# Patient Record
Sex: Male | Born: 1950
Health system: Southern US, Community
[De-identification: ages and names within clinical notes are randomized; demographics above are authoritative.]

## PROBLEM LIST (undated history)

## (undated) DIAGNOSIS — E291 Testicular hypofunction: Secondary | ICD-10-CM

## (undated) DIAGNOSIS — E559 Vitamin D deficiency, unspecified: Secondary | ICD-10-CM

## (undated) DIAGNOSIS — E039 Hypothyroidism, unspecified: Secondary | ICD-10-CM

## (undated) DIAGNOSIS — I10 Essential (primary) hypertension: Secondary | ICD-10-CM

## (undated) DIAGNOSIS — E785 Hyperlipidemia, unspecified: Secondary | ICD-10-CM

## (undated) DIAGNOSIS — N400 Enlarged prostate without lower urinary tract symptoms: Secondary | ICD-10-CM

## (undated) HISTORY — PX: HAND TENDON SURGERY: SHX663

## (undated) HISTORY — PX: TONSILLECTOMY: SUR1361

---
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
## 2011-06-08 ENCOUNTER — Emergency Department (HOSPITAL_COMMUNITY)
Admission: EM | Admit: 2011-06-08 | Discharge: 2011-06-08 | Disposition: A | Discharge status: Home / Self Care | Attending: Emergency Medicine | Admitting: Emergency Medicine

## 2011-06-08 DIAGNOSIS — IMO0001 Reserved for inherently not codable concepts without codable children: Secondary | ICD-10-CM | POA: Insufficient documentation

## 2011-06-08 DIAGNOSIS — R51 Headache: Secondary | ICD-10-CM | POA: Insufficient documentation

## 2011-06-08 DIAGNOSIS — R197 Diarrhea, unspecified: Secondary | ICD-10-CM | POA: Insufficient documentation

## 2011-06-08 DIAGNOSIS — R059 Cough, unspecified: Secondary | ICD-10-CM | POA: Insufficient documentation

## 2011-06-08 DIAGNOSIS — J329 Chronic sinusitis, unspecified: Secondary | ICD-10-CM

## 2011-06-08 DIAGNOSIS — R6883 Chills (without fever): Secondary | ICD-10-CM | POA: Insufficient documentation

## 2011-06-08 DIAGNOSIS — J3489 Other specified disorders of nose and nasal sinuses: Secondary | ICD-10-CM | POA: Insufficient documentation

## 2011-06-08 DIAGNOSIS — R05 Cough: Secondary | ICD-10-CM | POA: Insufficient documentation

## 2011-06-08 DIAGNOSIS — J40 Bronchitis, not specified as acute or chronic: Secondary | ICD-10-CM | POA: Insufficient documentation

## 2011-06-08 DIAGNOSIS — F17200 Nicotine dependence, unspecified, uncomplicated: Secondary | ICD-10-CM | POA: Insufficient documentation

## 2011-06-08 MED ORDER — PREDNISONE 20 MG PO TABS
60.0000 mg | ORAL_TABLET | Freq: Once | ORAL | Status: AC
  Administered 2011-06-08: 60 mg via ORAL
  Filled 2011-06-08: qty 3

## 2011-06-08 MED ORDER — HYDROCOD POLST-CHLORPHEN POLST 10-8 MG/5ML PO LQCR
5.0000 mL | Freq: Once | ORAL | Status: AC
  Administered 2011-06-08: 5 mL via ORAL
  Filled 2011-06-08: qty 5

## 2011-06-08 MED ORDER — PREDNISONE 10 MG PO TABS: ORAL_TABLET | ORAL | Status: DC

## 2011-06-08 MED ORDER — CETIRIZINE-PSEUDOEPHEDRINE ER 5-120 MG PO TB12: 1.0000 | ORAL_TABLET | Freq: Two times a day (BID) | ORAL | Status: AC

## 2011-06-08 MED ORDER — PROMETHAZINE-PHENYLEPHRINE 6.25-5 MG/5ML PO SYRP: 5.0000 mL | ORAL_SOLUTION | Freq: Four times a day (QID) | ORAL | Status: DC | PRN

## 2011-06-08 NOTE — ED Notes (Signed)
Pt presents with cough, congestion, and diarrhea x 2 weeks. Pt states he had a fever 2 weeks ago and started to feel better and now is back to not feeling well. Pt has missed work from being sick.

## 2011-06-08 NOTE — ED Notes (Signed)
Pt states has had flu/URI symptoms x 14 days. Pt has persistent cough and body aches. Remains afebrile at this time.

## 2011-06-08 NOTE — ED Provider Notes (Signed)
History     CSN: [id number]  Arrival date & time 06/08/11  1638   None     Chief Complaint  Patient presents with  . Nasal Congestion  . Cough  . Diarrhea    (Consider location/radiation/quality/duration/timing/severity/associated sxs/prior treatment) Patient is a 60 y.o. male presenting with cough and diarrhea. The history is provided by the patient.  Cough This is a new problem. The current episode started more than 1 week ago. The problem occurs hourly. The problem has been gradually worsening. The cough is productive of sputum. Associated symptoms include chills, headaches and myalgias. Pertinent negatives include no chest pain, no shortness of breath and no wheezing. Treatments tried: otc cough meds. He is a smoker. His past medical history does not include bronchitis, pneumonia, COPD or asthma.  Diarrhea The primary symptoms include diarrhea and myalgias. Primary symptoms do not include abdominal pain, dysuria or arthralgias.  The illness is also significant for chills. The illness does not include back pain.    History reviewed. No pertinent past medical history.  History reviewed. No pertinent past surgical history.  No family history on file.  History  Substance Use Topics  . Smoking status: Current Some Day Smoker  . Smokeless tobacco: Not on file  . Alcohol Use: Yes      Review of Systems  Constitutional: Positive for chills. Negative for activity change.       All ROS Neg except as noted in HPI  HENT: Negative for nosebleeds and neck pain.   Eyes: Negative for photophobia and discharge.  Respiratory: Positive for cough. Negative for shortness of breath and wheezing.   Cardiovascular: Negative for chest pain and palpitations.  Gastrointestinal: Positive for diarrhea. Negative for abdominal pain and blood in stool.  Genitourinary: Negative for dysuria, frequency and hematuria.  Musculoskeletal: Positive for myalgias. Negative for back pain and arthralgias.    Skin: Negative.   Neurological: Positive for headaches. Negative for dizziness, seizures and speech difficulty.  Psychiatric/Behavioral: Negative for hallucinations and confusion.    Allergies  Review of patient's allergies indicates no known allergies.  Home Medications  No current outpatient prescriptions on file.  BP 142/85  Pulse 89  Temp(Src) 98.5 F (36.9 C) (Oral)  Resp 20  Ht 5\' 10"  (1.778 m)  Wt 189 lb (85.73 kg)  BMI 27.12 kg/m2  SpO2 98%  Physical Exam  Nursing note and vitals reviewed. Constitutional: He is oriented to person, place, and time. He appears well-developed and well-nourished.  Non-toxic appearance.  HENT:  Head: Normocephalic.  Right Ear: Tympanic membrane and external ear normal.  Left Ear: Tympanic membrane and external ear normal.       Nasal congestion present  Eyes: EOM and lids are normal. Pupils are equal, round, and reactive to light.  Neck: Normal range of motion. Neck supple. Carotid bruit is not present.  Cardiovascular: Normal rate, regular rhythm, normal heart sounds, intact distal pulses and normal pulses.   Pulmonary/Chest: Breath sounds normal. No respiratory distress.       Few scattered rhonchi present. No focal consolidation.  Abdominal: Soft. Bowel sounds are normal. There is no tenderness. There is no guarding.  Musculoskeletal: Normal range of motion.  Lymphadenopathy:       Head (right side): No submandibular adenopathy present.       Head (left side): No submandibular adenopathy present.    He has no cervical adenopathy.  Neurological: He is alert and oriented to person, place, and time. He has normal strength. No  cranial nerve deficit or sensory deficit.  Skin: Skin is warm and dry.  Psychiatric: He has a normal mood and affect. His speech is normal.    ED Course  Procedures (including critical care time) Pulse oximetry 98% on room air. Within normal limits by my interpretation. Labs Reviewed - No data to display No  results found.   Dx: 1. Sinusitis. 2. Bronchitis.   MDM  I have reviewed nursing notes, vital signs, and all appropriate lab and imaging results for this patient.    Pt advised to increase fluids. Use tylenol for aching. Rx for Zyrtec D, Deltasone, and Promethazine given to pt.    Kathie Dike, [place] 06/08/11 [phone]

## 2011-06-09 NOTE — ED Provider Notes (Signed)
Medical screening examination/treatment/procedure(s) were performed by non-physician practitioner and as supervising physician I was immediately available for consultation/collaboration.  Jamaiyah Pyle R. Darnita Woodrum, MD 06/09/11 0052 

## 2012-10-20 ENCOUNTER — Other Ambulatory Visit (HOSPITAL_COMMUNITY): Payer: Self-pay | Admitting: Internal Medicine

## 2012-10-20 DIAGNOSIS — R599 Enlarged lymph nodes, unspecified: Secondary | ICD-10-CM

## 2012-10-22 ENCOUNTER — Ambulatory Visit (HOSPITAL_COMMUNITY)
Admission: RE | Admit: 2012-10-22 | Discharge: 2012-10-22 | Disposition: A | Discharge status: Home / Self Care | Source: Ambulatory Visit | Attending: Internal Medicine | Admitting: Internal Medicine

## 2012-10-22 DIAGNOSIS — Z1389 Encounter for screening for other disorder: Secondary | ICD-10-CM | POA: Insufficient documentation

## 2012-10-22 DIAGNOSIS — R599 Enlarged lymph nodes, unspecified: Secondary | ICD-10-CM

## 2012-10-27 ENCOUNTER — Encounter (INDEPENDENT_AMBULATORY_CARE_PROVIDER_SITE_OTHER): Payer: Self-pay | Admitting: *Deleted

## 2015-02-24 ENCOUNTER — Emergency Department (HOSPITAL_COMMUNITY)
Admission: EM | Admit: 2015-02-24 | Discharge: 2015-02-24 | Disposition: A | Discharge status: Home / Self Care | Attending: Physician Assistant | Admitting: Physician Assistant

## 2015-02-24 ENCOUNTER — Emergency Department (HOSPITAL_COMMUNITY)

## 2015-02-24 ENCOUNTER — Encounter (HOSPITAL_COMMUNITY): Payer: Self-pay | Admitting: Emergency Medicine

## 2015-02-24 DIAGNOSIS — Z720 Tobacco use: Secondary | ICD-10-CM | POA: Insufficient documentation

## 2015-02-24 DIAGNOSIS — S99921A Unspecified injury of right foot, initial encounter: Secondary | ICD-10-CM | POA: Diagnosis not present

## 2015-02-24 DIAGNOSIS — Y9389 Activity, other specified: Secondary | ICD-10-CM | POA: Insufficient documentation

## 2015-02-24 DIAGNOSIS — Z87438 Personal history of other diseases of male genital organs: Secondary | ICD-10-CM | POA: Diagnosis not present

## 2015-02-24 DIAGNOSIS — S3992XA Unspecified injury of lower back, initial encounter: Secondary | ICD-10-CM | POA: Insufficient documentation

## 2015-02-24 DIAGNOSIS — Y998 Other external cause status: Secondary | ICD-10-CM | POA: Diagnosis not present

## 2015-02-24 DIAGNOSIS — S9032XA Contusion of left foot, initial encounter: Secondary | ICD-10-CM | POA: Insufficient documentation

## 2015-02-24 DIAGNOSIS — I10 Essential (primary) hypertension: Secondary | ICD-10-CM | POA: Insufficient documentation

## 2015-02-24 DIAGNOSIS — W11XXXA Fall on and from ladder, initial encounter: Secondary | ICD-10-CM | POA: Insufficient documentation

## 2015-02-24 DIAGNOSIS — Y9289 Other specified places as the place of occurrence of the external cause: Secondary | ICD-10-CM | POA: Insufficient documentation

## 2015-02-24 DIAGNOSIS — W19XXXA Unspecified fall, initial encounter: Secondary | ICD-10-CM

## 2015-02-24 HISTORY — DX: Benign prostatic hyperplasia without lower urinary tract symptoms: N40.0

## 2015-02-24 HISTORY — DX: Essential (primary) hypertension: I10

## 2015-02-24 MED ORDER — IBUPROFEN 800 MG PO TABS: 800.0000 mg | ORAL_TABLET | Freq: Three times a day (TID) | ORAL | Status: DC

## 2015-02-24 MED ORDER — ACETAMINOPHEN 325 MG PO TABS
650.0000 mg | ORAL_TABLET | Freq: Once | ORAL | Status: AC
  Administered 2015-02-24: 650 mg via ORAL
  Filled 2015-02-24: qty 2

## 2015-02-24 NOTE — ED Provider Notes (Signed)
CSN: [id number]     Arrival date & time 02/24/15  1617 History   First MD Initiated Contact with Patient 02/24/15 1648     Chief Complaint  Patient presents with  . Foot Pain  . 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 Presented today with after falling 10 feet from ladder onto his bilateral heels. Patient initially had no pain and then developed mild pain in bilateral heels mostly on the left. Patient also has some amount of tenderness and soreness in his left lumbar area, mostly paraspinal in the muscle tissue. No numbness no tingling. No red flags.   Patient did not hit his head. No loss of consciousness.  Past Medical History  Diagnosis Date  . Hypertension   . BPH (benign prostatic hyperplasia)    Past Surgical History  Procedure Laterality Date  . Hand tendon surgery    . Tonsillectomy     History reviewed. No pertinent family history. Social History  Substance Use Topics  . Smoking status: Current Some Day Smoker -- 0.50 packs/day    Types: Cigarettes  . Smokeless tobacco: None  . Alcohol Use: Yes     Comment: occasionally    Review of Systems  Constitutional: Negative for fever and activity change.  Respiratory: Negative for cough and shortness of breath.   Cardiovascular: Negative for chest pain.  Gastrointestinal: Negative for abdominal pain.  Genitourinary: Negative for dysuria and urgency.  Musculoskeletal: Positive for myalgias and back pain. Negative for arthralgias.  Allergic/Immunologic: Negative for immunocompromised state.  Neurological: Negative for headaches.  Psychiatric/Behavioral: Negative for behavioral problems and agitation.  All other systems reviewed and are negative.     Allergies  Review of patient's allergies indicates no known allergies.  Home Medications   Prior to Admission medications   Medication Sig  Start Date End Date Taking? Authorizing Provider  predniSONE (DELTASONE) 10 MG tablet 6,5,4,3,2,1 - take with food 06/08/11   Lily Kocher, PA-C  promethazine-phenylephrine (PROMETHAZINE-PHENYLEPHRINE) 6.25-5 MG/5ML SYRP Take 5 mLs by mouth every 6 (six) hours as needed. 06/08/11   Lily Kocher, PA-C   BP 119/66 mmHg  Pulse 78  Temp(Src) 97.8 F (36.6 C) (Oral)  Resp 20  Ht 5\' 10"  (1.778 m)  Wt 187 lb (84.823 kg)  BMI 26.83 kg/m2  SpO2 98% Physical Exam  Constitutional: He is oriented to person, place, and time. He appears well-nourished.  HENT:  Head: Normocephalic.  Mouth/Throat: Oropharynx is clear and moist.  Eyes: Conjunctivae are normal.  Neck: No tracheal deviation present.  Cardiovascular: Normal rate.   Pulmonary/Chest: Effort normal. No stridor. No respiratory distress.  Abdominal: Soft. There is no tenderness. There is no guarding.  Musculoskeletal: Normal range of motion. He exhibits no edema.  Bruising to the left calcaneus.  Tenderness to left paraspinal lumbar region. No CT or L-spine tenderness. Full range of motion. Ambulatory.  Neurological: He is oriented to person, place, and time. No cranial nerve deficit.  Skin: Skin is warm and dry. No rash noted. He is not diaphoretic.  Psychiatric: He has a normal mood and affect. His behavior is normal.  Nursing note and vitals reviewed.   ED Course  Procedures (including critical care time) Labs Review Labs Reviewed - No data to display  Imaging Review No results found. I have personally reviewed and evaluated these images and lab results as part of my medical decision-making.   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 He fell onto his bilateral heels. Concern for calcaneal fracture. Additionally concern for lumbar fracture given the incidence of contaminant lumbar and calcaneal  fractures. We'll get x-ray imaging of these. Patient has very little discomfort. He took ibuprofenprior to arrival we will give him some acetaminophen.  He ambulatory anticipate ability to discharge home.  Courteney Julio Alm, MD 02/24/15 2322

## 2015-02-24 NOTE — Discharge Instructions (Signed)
Your x-ray was normal. We did not see any fractures. However if the pain increases or changes in any way please return to emergency department.

## 2015-02-24 NOTE — ED Notes (Signed)
Patient states he was on a ladder and a limb knocked the ladder over. States he fell approximately 10 feet, landing on bilateral heels, then falling back onto his back area. Denies head injury or LOC. Complaining of pain in bilateral heels, left flank, and lower back area. Patient ambulatory at triage with no assistance.

## 2017-01-13 ENCOUNTER — Encounter: Payer: Self-pay | Admitting: *Deleted

## 2017-01-13 NOTE — Progress Notes (Signed)
Cardiology Office Note   Date:  01/14/2017   ID:  Edwin Perez, DOB 1951/02/16, MRN [id number]  PCP:  Doree Albee, MD  Cardiologist:   Jenkins Rouge, MD   No chief complaint on file.     History of Present Illness: Edwin Perez is a 66 y.o. male who presents for consultation regarding abnormal ECG. Referred by Dr Anastasio Champion. PMH Remarkable for HTN, elevated lipids, Hypothyroidism and BPH. Former smoker quit in July 2017 He has no cardiac history no chest pain Does boxing exercise at 9 Rounds 3 x week Mild exertional dyspnea. Still chewing tobacco. Compliant with meds. No history of asthma, PFTls done by primary showed FEV1 mildly decreased suggesting mild COPD form smoking    Past Medical History:  Diagnosis Date  . BPH (benign prostatic hyperplasia)   . Hypertension     Past Surgical History:  Procedure Laterality Date  . HAND TENDON SURGERY    . TONSILLECTOMY       Current Outpatient Prescriptions  Medication Sig Dispense Refill  . Calcium-Magnesium-Zinc 167-83-8 MG TABS Take 1 tablet by mouth daily.    . Cholecalciferol (VITAMIN D3) 5000 units CAPS Take 1 capsule by mouth daily.    . cyanocobalamin 2000 MCG tablet Take 2,000 mcg by mouth daily.    Marland Kitchen levothyroxine (SYNTHROID, LEVOTHROID) 25 MCG tablet Take 25 mcg by mouth daily before breakfast.    . tadalafil (CIALIS) 5 MG tablet Take 5 mg by mouth daily.    Marland Kitchen testosterone cypionate (DEPOTESTOTERONE CYPIONATE) 100 MG/ML injection Inject 100 mg into the muscle as directed. For IM use only     No current facility-administered medications for this visit.     Allergies:   Patient has no known allergies.    Social History:  The patient  reports that he quit smoking about 13 months ago. His smoking use included Cigarettes. He started smoking about 46 years ago. He has a 45.00 pack-year smoking history. His smokeless tobacco use includes Snuff. He reports that he drinks alcohol. He reports that he does not use drugs.     Family History:  The patient's family history is not on file.    ROS:  Please see the history of present illness.   Otherwise, review of systems are positive for none.   All other systems are reviewed and negative.    PHYSICAL EXAM: VS:  BP (!) 148/80 (BP Location: Right Arm, Cuff Size: Normal)   Pulse 71   Ht 5\' 9"  (1.753 m)   Wt 192 lb 6.4 oz (87.3 kg)   SpO2 96%   BMI 28.41 kg/m  , BMI Body mass index is 28.41 kg/m. Affect appropriate Healthy:  appears stated age 89: normal Neck supple with no adenopathy JVP normal no bruits no thyromegaly Lungs clear with no wheezing and good diaphragmatic motion Heart:  S1/S2 no murmur, no rub, gallop or click PMI normal Abdomen: benighn, BS positve, no tenderness, no AAA no bruit.  No HSM or HJR Distal pulses intact with no bruits No edema Neuro non-focal Skin warm and dry No muscular weakness    EKG:  ECG SR rate 63 Q V12 read as anteroseptal MI otherwise normal    Recent Labs: No results found for requested labs within last 8760 hours.    Lipid Panel No results found for: CHOL, TRIG, HDL, CHOLHDL, VLDL, LDLCALC, LDLDIRECT    Wt Readings from Last 3 Encounters:  01/14/17 192 lb 6.4 oz (87.3 kg)  02/24/15 187 lb (  84.8 kg)  06/08/11 189 lb (85.7 kg)      Other studies Reviewed: Additional studies/ records that were reviewed today include: Notes Dr Anastasio Champion ECG;s .    ASSESSMENT AND PLAN:  1.  Abnormal ECG likely lead placement doubt old MI see below 2. HTN: Well controlled.  Continue current medications and low sodium Dash type diet.   3. Lipids diet Rx labs with primary  4. BPH f/u primary PSA ok 5. Dyspnea: with concern for CAD will order f/u exercise echo to assess both CAD and SOB    Current medicines are reviewed at length with the patient today.  The patient does not have concerns regarding medicines.  The following changes have been made:  no change  Labs/ tests ordered today include: Ex  Echo  Orders Placed This Encounter  Procedures  . NM Myocar Multi W/Spect W/Wall Motion / EF  . ECHOCARDIOGRAM STRESS TEST     Disposition:   FU with cardiology PRN     Signed, Jenkins Rouge, MD  01/14/2017 3:29 PM    [address], [place], [place]  [address] Phone: [phone]; Fax: [phone]

## 2017-01-14 ENCOUNTER — Encounter: Payer: Self-pay | Admitting: *Deleted

## 2017-01-14 ENCOUNTER — Encounter: Payer: Self-pay | Admitting: Cardiovascular Disease

## 2017-01-14 ENCOUNTER — Ambulatory Visit (INDEPENDENT_AMBULATORY_CARE_PROVIDER_SITE_OTHER): Admitting: Cardiovascular Disease

## 2017-01-14 VITALS — BP 148/80 | HR 71 | Ht 69.0 in | Wt 192.4 lb

## 2017-01-14 DIAGNOSIS — R0600 Dyspnea, unspecified: Secondary | ICD-10-CM

## 2017-01-14 NOTE — Patient Instructions (Addendum)
Medication Instructions:  Your physician recommends that you continue on your current medications as directed. Please refer to the Current Medication list given to you today.   Labwork: NONE   Testing/Procedures: Your physician has requested that you have a stress echocardiogram. For further information please visit [email or web address]. Please follow instruction sheet as given.   Follow-Up: Your physician recommends that you schedule a follow-up appointment in: As Needed    Any Other Special Instructions Will Be Listed Below (If Applicable).     If you need a refill on your cardiac medications before your next appointment, please call your pharmacy.

## 2017-01-20 ENCOUNTER — Ambulatory Visit (HOSPITAL_COMMUNITY)
Admission: RE | Admit: 2017-01-20 | Discharge: 2017-01-20 | Disposition: A | Discharge status: Home / Self Care | Source: Ambulatory Visit | Attending: Cardiovascular Disease | Admitting: Cardiovascular Disease

## 2017-01-20 DIAGNOSIS — R0600 Dyspnea, unspecified: Secondary | ICD-10-CM | POA: Insufficient documentation

## 2017-01-20 DIAGNOSIS — R9431 Abnormal electrocardiogram [ECG] [EKG]: Secondary | ICD-10-CM | POA: Diagnosis not present

## 2017-01-20 LAB — ECHOCARDIOGRAM STRESS TEST
CHL CUP RESTING HR STRESS: 59 {beats}/min
CSEPEW: 13.4 METS
CSEPPHR: 137 {beats}/min
Exercise duration (min): 10 min
Exercise duration (sec): 58 s
MPHR: 156 {beats}/min
Percent HR: 87 %
RPE: 15

## 2017-01-20 NOTE — Progress Notes (Signed)
*  PRELIMINARY RESULTS* Echocardiogram Echocardiogram Stress Test has been performed.  Edwin Perez 01/20/2017, 10:08 AM

## 2017-01-21 ENCOUNTER — Telehealth: Payer: Self-pay

## 2017-01-21 NOTE — Telephone Encounter (Signed)
Called pt, no answer- left message for pt to return call.  

## 2017-01-21 NOTE — Telephone Encounter (Signed)
-----   Message from Josue Hector, MD sent at 01/20/2017  3:54 PM EDT ----- ECG abnormal with stress but echo images totally normal low risk f/u with NP or me in 6 months

## 2017-08-07 DIAGNOSIS — E039 Hypothyroidism, unspecified: Secondary | ICD-10-CM | POA: Diagnosis not present

## 2017-08-07 DIAGNOSIS — I10 Essential (primary) hypertension: Secondary | ICD-10-CM | POA: Diagnosis not present

## 2017-08-07 DIAGNOSIS — E785 Hyperlipidemia, unspecified: Secondary | ICD-10-CM | POA: Diagnosis not present

## 2017-08-07 DIAGNOSIS — E291 Testicular hypofunction: Secondary | ICD-10-CM | POA: Diagnosis not present

## 2017-09-23 DIAGNOSIS — J301 Allergic rhinitis due to pollen: Secondary | ICD-10-CM | POA: Diagnosis not present

## 2017-09-23 DIAGNOSIS — E039 Hypothyroidism, unspecified: Secondary | ICD-10-CM | POA: Diagnosis not present

## 2017-09-23 DIAGNOSIS — R5383 Other fatigue: Secondary | ICD-10-CM | POA: Diagnosis not present

## 2017-11-25 DIAGNOSIS — R5382 Chronic fatigue, unspecified: Secondary | ICD-10-CM | POA: Diagnosis not present

## 2017-11-25 DIAGNOSIS — J301 Allergic rhinitis due to pollen: Secondary | ICD-10-CM | POA: Diagnosis not present

## 2017-11-25 DIAGNOSIS — E039 Hypothyroidism, unspecified: Secondary | ICD-10-CM | POA: Diagnosis not present

## 2017-11-25 DIAGNOSIS — I10 Essential (primary) hypertension: Secondary | ICD-10-CM | POA: Diagnosis not present

## 2017-12-08 DIAGNOSIS — L237 Allergic contact dermatitis due to plants, except food: Secondary | ICD-10-CM | POA: Diagnosis not present

## 2017-12-08 DIAGNOSIS — L570 Actinic keratosis: Secondary | ICD-10-CM | POA: Diagnosis not present

## 2017-12-08 DIAGNOSIS — Z85828 Personal history of other malignant neoplasm of skin: Secondary | ICD-10-CM | POA: Diagnosis not present

## 2018-02-05 DIAGNOSIS — K5900 Constipation, unspecified: Secondary | ICD-10-CM | POA: Diagnosis not present

## 2018-03-26 DIAGNOSIS — E039 Hypothyroidism, unspecified: Secondary | ICD-10-CM | POA: Diagnosis not present

## 2018-03-26 DIAGNOSIS — N401 Enlarged prostate with lower urinary tract symptoms: Secondary | ICD-10-CM | POA: Diagnosis not present

## 2018-03-26 DIAGNOSIS — I10 Essential (primary) hypertension: Secondary | ICD-10-CM | POA: Diagnosis not present

## 2018-03-26 DIAGNOSIS — E559 Vitamin D deficiency, unspecified: Secondary | ICD-10-CM | POA: Diagnosis not present

## 2018-03-26 DIAGNOSIS — R5383 Other fatigue: Secondary | ICD-10-CM | POA: Diagnosis not present

## 2018-03-26 DIAGNOSIS — E291 Testicular hypofunction: Secondary | ICD-10-CM | POA: Diagnosis not present

## 2018-03-26 DIAGNOSIS — R972 Elevated prostate specific antigen [PSA]: Secondary | ICD-10-CM | POA: Diagnosis not present

## 2018-06-02 DIAGNOSIS — Z1211 Encounter for screening for malignant neoplasm of colon: Secondary | ICD-10-CM | POA: Diagnosis not present

## 2018-06-02 DIAGNOSIS — Z7189 Other specified counseling: Secondary | ICD-10-CM | POA: Diagnosis not present

## 2018-06-02 DIAGNOSIS — I10 Essential (primary) hypertension: Secondary | ICD-10-CM | POA: Diagnosis not present

## 2018-06-02 DIAGNOSIS — Z139 Encounter for screening, unspecified: Secondary | ICD-10-CM | POA: Diagnosis not present

## 2018-06-16 DIAGNOSIS — Z85828 Personal history of other malignant neoplasm of skin: Secondary | ICD-10-CM | POA: Diagnosis not present

## 2018-06-16 DIAGNOSIS — L570 Actinic keratosis: Secondary | ICD-10-CM | POA: Diagnosis not present

## 2018-06-16 DIAGNOSIS — L821 Other seborrheic keratosis: Secondary | ICD-10-CM | POA: Diagnosis not present

## 2018-06-30 DIAGNOSIS — E039 Hypothyroidism, unspecified: Secondary | ICD-10-CM | POA: Diagnosis not present

## 2018-06-30 DIAGNOSIS — I10 Essential (primary) hypertension: Secondary | ICD-10-CM | POA: Diagnosis not present

## 2018-06-30 DIAGNOSIS — E291 Testicular hypofunction: Secondary | ICD-10-CM | POA: Diagnosis not present

## 2018-08-13 DIAGNOSIS — I10 Essential (primary) hypertension: Secondary | ICD-10-CM | POA: Diagnosis not present

## 2018-08-13 DIAGNOSIS — E291 Testicular hypofunction: Secondary | ICD-10-CM | POA: Diagnosis not present

## 2018-08-13 DIAGNOSIS — E039 Hypothyroidism, unspecified: Secondary | ICD-10-CM | POA: Diagnosis not present

## 2018-08-13 DIAGNOSIS — R5383 Other fatigue: Secondary | ICD-10-CM | POA: Diagnosis not present

## 2018-11-19 ENCOUNTER — Ambulatory Visit (INDEPENDENT_AMBULATORY_CARE_PROVIDER_SITE_OTHER): Admitting: Internal Medicine

## 2018-11-19 DIAGNOSIS — I10 Essential (primary) hypertension: Secondary | ICD-10-CM | POA: Diagnosis not present

## 2018-11-19 DIAGNOSIS — E039 Hypothyroidism, unspecified: Secondary | ICD-10-CM | POA: Diagnosis not present

## 2018-11-19 DIAGNOSIS — E559 Vitamin D deficiency, unspecified: Secondary | ICD-10-CM | POA: Diagnosis not present

## 2018-11-19 DIAGNOSIS — E291 Testicular hypofunction: Secondary | ICD-10-CM | POA: Diagnosis not present

## 2018-12-14 ENCOUNTER — Other Ambulatory Visit: Payer: TRICARE For Life (TFL)

## 2018-12-14 ENCOUNTER — Telehealth (INDEPENDENT_AMBULATORY_CARE_PROVIDER_SITE_OTHER): Payer: Self-pay | Admitting: Internal Medicine

## 2018-12-14 ENCOUNTER — Telehealth: Payer: Self-pay | Admitting: Internal Medicine

## 2018-12-14 DIAGNOSIS — Z20822 Contact with and (suspected) exposure to covid-19: Secondary | ICD-10-CM

## 2018-12-14 NOTE — Telephone Encounter (Signed)
Pt scheduled for covid testing at [facility] site today.  Possible exposure  Requested by Dr. Hurshel Party  Testing process reviewed, wear mask, stay in car. Pt verbalizes understanding.  CB# [phone]

## 2018-12-14 NOTE — Telephone Encounter (Signed)
Left voicemail for patient to return call to schedule COVID 19 test.  Order entered.

## 2018-12-14 NOTE — Telephone Encounter (Signed)
Dr Anastasio Champion called to place order for the pt to be tested.  pt has been in direct contact with positive person. Pt is asymptomatic  Please call pt to schedule appt.

## 2018-12-19 LAB — NOVEL CORONAVIRUS, NAA: SARS-CoV-2, NAA: NOT DETECTED

## 2019-02-22 ENCOUNTER — Other Ambulatory Visit (INDEPENDENT_AMBULATORY_CARE_PROVIDER_SITE_OTHER): Payer: Self-pay | Admitting: Internal Medicine

## 2019-02-22 ENCOUNTER — Telehealth (INDEPENDENT_AMBULATORY_CARE_PROVIDER_SITE_OTHER): Payer: Self-pay

## 2019-02-22 MED ORDER — TADALAFIL 5 MG PO TABS: 5.0000 mg | ORAL_TABLET | Freq: Every day | ORAL | 3 refills | Status: DC

## 2019-02-22 NOTE — Telephone Encounter (Signed)
Faxed needle Rx to [facility].

## 2019-02-25 ENCOUNTER — Other Ambulatory Visit: Payer: Self-pay

## 2019-02-25 ENCOUNTER — Ambulatory Visit (INDEPENDENT_AMBULATORY_CARE_PROVIDER_SITE_OTHER): Payer: Medicare Other | Admitting: Internal Medicine

## 2019-02-25 ENCOUNTER — Encounter (INDEPENDENT_AMBULATORY_CARE_PROVIDER_SITE_OTHER): Payer: Self-pay | Admitting: Internal Medicine

## 2019-02-25 VITALS — BP 140/70 | HR 72 | Ht 69.0 in | Wt 196.0 lb

## 2019-02-25 DIAGNOSIS — E782 Mixed hyperlipidemia: Secondary | ICD-10-CM | POA: Diagnosis not present

## 2019-02-25 DIAGNOSIS — E291 Testicular hypofunction: Secondary | ICD-10-CM

## 2019-02-25 DIAGNOSIS — E039 Hypothyroidism, unspecified: Secondary | ICD-10-CM | POA: Insufficient documentation

## 2019-02-25 DIAGNOSIS — E559 Vitamin D deficiency, unspecified: Secondary | ICD-10-CM

## 2019-02-25 DIAGNOSIS — I10 Essential (primary) hypertension: Secondary | ICD-10-CM | POA: Diagnosis not present

## 2019-02-25 DIAGNOSIS — E785 Hyperlipidemia, unspecified: Secondary | ICD-10-CM

## 2019-02-25 DIAGNOSIS — Z1159 Encounter for screening for other viral diseases: Secondary | ICD-10-CM

## 2019-02-25 HISTORY — DX: Vitamin D deficiency, unspecified: E55.9

## 2019-02-25 HISTORY — DX: Testicular hypofunction: E29.1

## 2019-02-25 HISTORY — DX: Essential (primary) hypertension: I10

## 2019-02-25 HISTORY — DX: Hyperlipidemia, unspecified: E78.5

## 2019-02-25 HISTORY — DX: Hypothyroidism, unspecified: E03.9

## 2019-02-25 NOTE — Patient Instructions (Signed)
[facility] Dietary Recommendations for Weight Loss What to Avoid . Avoid added sugars o Often added sugar can be found in processed foods such as many condiments, dry cereals, cakes, cookies, chips, crisps, crackers, candies, sweetened drinks, etc.  o Read labels and AVOID/DECREASE use of foods with the following in their ingredient list: Sugar, fructose, high fructose corn syrup, sucrose, glucose, maltose, dextrose, molasses, cane sugar, brown sugar, any type of syrup, agave nectar, etc.   . Avoid snacking in between meals . Avoid foods made with flour o If you are going to eat food made with flour, choose those made with whole-grains; and, minimize your consumption as much as is tolerable . Avoid processed foods o These foods are generally stocked in the middle of the grocery store. Focus on shopping on the perimeter of the grocery.  What to Include . Vegetables o GREEN LEAFY VEGETABLES: Kale, spinach, mustard greens, collard greens, cabbage, broccoli, etc. o OTHER: Asparagus, cauliflower, eggplant, carrots, peas, Brussel sprouts, tomatoes, bell peppers, zucchini, beets, cucumbers, etc. . Grains, seeds, and legumes o Beans: kidney beans, black eyed peas, garbanzo beans, black beans, pinto beans, etc. o Whole, unrefined grains: brown rice, barley, bulgur, oatmeal, etc. . Healthy fats  o Avoid highly processed fats such as vegetable oil o Examples of healthy fats: avocado, olives, virgin olive oil, dark chocolate (?72% Cocoa), nuts (peanuts, almonds, walnuts, cashews, pecans, etc.) . Low - Moderate Intake of Animal Sources of Protein o Meat sources: chicken, turkey, salmon, tuna. Limit to 4 ounces of meat at one time. o Consider limiting dairy sources, but when choosing dairy focus on: PLAIN Greek yogurt, cottage cheese, high-protein milk . Fruit o Choose berries  When to Eat . Intermittent Fasting: o Choosing not to eat for a specific time period, but DO FOCUS ON HYDRATION  when fasting o Multiple Techniques: - Time Restricted Eating: eat 3 meals in a day, each meal lasting no more than 60 minutes, no snacks between meals - 16-18 hour fast: fast for 16 to 18 hours up to 7 days a week. Often suggested to start with 2-3 nonconsecutive days per week.  . Remember the time you sleep is counted as fasting.  . Examples of eating schedule: Fast from 7:00pm-11:00am. Eat between 11:00am-7:00pm.  - 24-hour fast: fast for 24 hours up to every other day. Often suggested to start with 1 day per week . Remember the time you sleep is counted as fasting . Examples of eating schedule:  o Eating day: eat 2-3 meals on your eating day. If doing 2 meals, each meal should last no more than 90 minutes. If doing 3 meals, each meal should last no more than 60 minutes. Finish last meal by 7:00pm. o Fasting day: Fast until 7:00pm.  o IF YOU FEEL UNWELL FOR ANY REASON/IN ANY WAY WHEN FASTING, STOP FASTING BY EATING A NUTRITIOUS SNACK OR LIGHT MEAL o ALWAYS FOCUS ON HYDRATION DURING FASTS - Acceptable Hydration sources: water, broths, tea/coffee (black tea/coffee is best but using a small amount of whole-fat dairy products in coffee/tea is acceptable).  - Poor Hydration Sources: anything with sugar or artificial sweeteners added to it  These recommendations have been developed for patients that are actively receiving medical care from either Dr. Trashawn Oquendo or Sarah Gray, DNP, NP-C at [facility]. These recommendations are developed for patients with specific medical conditions and are not meant to be distributed or used by others that are not actively receiving care from either provider listed   above at [facility]. It is not appropriate to participate in the above eating plans without proper medical supervision.   Reference: Fung, J. The obesity code. [place]: [place]; 2016.   

## 2019-02-25 NOTE — Progress Notes (Signed)
Wellness Office Visit  Subjective:  Patient ID: Edwin Perez, male    DOB: [birth year]  Age: 68 y.o. MRN: [id number]  CC: This man comes in for his chronic medical conditions which include hypertension, hyperlipidemia, hypothyroidism and hypogonadism. HPI Overall, he is doing reasonably well but he is stressed and frustrated with the current state of events in our [place] at the present time and this causes him to get angry at times. He continues on testosterone therapy for his hypogonadism without any problems. He also continues on desiccated thyroid for hypothyroidism. His hyperlipidemia has not required any statin therapy. He denies any chest pain, dyspnea or palpitations.  Past Medical History:  Diagnosis Date   BPH (benign prostatic hyperplasia)    Essential hypertension, benign 02/25/2019   HLD (hyperlipidemia) 02/25/2019   Hypertension    Hypothyroidism, adult 02/25/2019   Testicular failure 02/25/2019   Vitamin D deficiency disease 02/25/2019      No family history on file.  Social History   Social History Narrative   Not on file     Current Meds  Medication Sig   Cholecalciferol (VITAMIN D3) 5000 units CAPS Take 1 capsule by mouth daily.   NP THYROID 90 MG tablet Take 90 mg by mouth daily.   tadalafil (CIALIS) 5 MG tablet Take 1 tablet (5 mg total) by mouth daily.   testosterone cypionate (DEPOTESTOSTERONE CYPIONATE) 200 MG/ML injection Inject 100 mg into the muscle 2 (two) times a week. For IM use only      Nutrition  His nutrition is not consistent at the present time, likely because of stress. Sleep  He does sleep well, having 7 to 8 hours of under interrupted sleep.  Exercise  He is not exercising consistently at the present time but he plans to. Bio Identical Hormones  Testosterone therapy is being used off label for symptoms of testosterone deficiency and benefits that it produces based on several studies.  These benefits include  decreasing body fat, increasing in lean muscle mass and increasing in bone density.  There is improvement of memory, cognition.  There is improvement in exercise tolerance and endurance.  Testosterone therapy has also been shown to be protective against coronary artery disease, cerebrovascular disease, diabetes, hypertension and degenerative joint disease. I have discussed with the patient the FDA warnings regarding testosterone therapy, benefits and side effects and modes of administration as well as monitoring blood levels and side effects  on a regular basis The patient is agreeable that testosterone therapy should be an integral part of his/her wellness,quality of life and prevention of chronic disease.  Objective:   Today's Vitals: BP 140/70    Pulse 72    Ht 5\' 9"  (1.753 m)    Wt 196 lb (88.9 kg)    BMI 28.94 kg/m  Vitals with BMI 02/25/2019 01/14/2017 01/14/2017  Height 5\' 9"  - 5\' 9"   Weight 196 lbs - 192 lbs 6 oz  BMI [id number] - [id number]  Systolic [id number]  Diastolic 70 80 80  Pulse 72 71 69     Physical Exam   He looks systemically well.  Alert and orientated without any focal neurological signs.    Assessment   1. Encounter for hepatitis C screening test for low risk patient   2. Essential hypertension, benign   3. Testicular failure   4. Hypothyroidism, adult   5. Vitamin D deficiency disease   6. Mixed hyperlipidemia      Plan: 1. Continue with all medications for  his chronic conditions. 2. Blood work is ordered as below. 3. Further recommendations will depend on blood results and I will see him for follow-up in the next few months.  Tests ordered Orders Placed This Encounter  Procedures   COMPLETE METABOLIC PANEL WITH GFR   VITAMIN D 25 Hydroxy (Vit-D Deficiency, Fractures)   Hepatitis C antibody   Testosterone Total,Free,Bio, Males   Lipid panel   T3, free   TSH     Isabelle Matt Luther Parody, MD

## 2019-02-26 LAB — COMPLETE METABOLIC PANEL WITH GFR
AG Ratio: 1.6 (calc) (ref 1.0–2.5)
ALT: 22 U/L (ref 9–46)
AST: 28 U/L (ref 10–35)
Albumin: 4.1 g/dL (ref 3.6–5.1)
Alkaline phosphatase (APISO): 36 U/L (ref 35–144)
BUN/Creatinine Ratio: 9 (calc) (ref 6–22)
BUN: 12 mg/dL (ref 7–25)
CO2: 27 mmol/L (ref 20–32)
Calcium: 9.4 mg/dL (ref 8.6–10.3)
Chloride: 105 mmol/L (ref 98–110)
Creat: 1.32 mg/dL — ABNORMAL HIGH (ref 0.70–1.25)
GFR, Est African American: 65 mL/min/{1.73_m2} (ref >=60)
GFR, Est Non African American: 56 mL/min/{1.73_m2} — ABNORMAL LOW (ref >=60)
Globulin: 2.5 g/dL (calc) (ref 1.9–3.7)
Glucose, Bld: 82 mg/dL (ref 65–99)
Potassium: 4.8 mmol/L (ref 3.5–5.3)
Sodium: 139 mmol/L (ref 135–146)
Total Bilirubin: 0.7 mg/dL (ref 0.2–1.2)
Total Protein: 6.6 g/dL (ref 6.1–8.1)

## 2019-02-26 LAB — HEPATITIS C ANTIBODY
Hepatitis C Ab: NONREACTIVE
SIGNAL TO CUT-OFF: 0.04 (ref <1.00)

## 2019-02-26 LAB — TESTOSTERONE TOTAL,FREE,BIO, MALES
Albumin: 4.1 g/dL (ref 3.6–5.1)
Sex Hormone Binding: 40 nmol/L (ref 22–77)
Testosterone, Bioavailable: 436.2 ng/dL (ref >=110.0)
Testosterone, Free: 231.7 pg/mL — ABNORMAL HIGH (ref 46.0–224.0)
Testosterone: 1464 ng/dL — ABNORMAL HIGH (ref 250–827)

## 2019-02-26 LAB — LIPID PANEL
Cholesterol: 183 mg/dL (ref <200)
HDL: 46 mg/dL (ref >=40)
LDL Cholesterol (Calc): 116 mg/dL (calc) — ABNORMAL HIGH
Non-HDL Cholesterol (Calc): 137 mg/dL (calc) — ABNORMAL HIGH (ref <130)
Total CHOL/HDL Ratio: 4 (calc) (ref <5.0)
Triglycerides: 105 mg/dL (ref <150)

## 2019-02-26 LAB — TSH: TSH: 0.7 mIU/L (ref 0.40–4.50)

## 2019-02-26 LAB — VITAMIN D 25 HYDROXY (VIT D DEFICIENCY, FRACTURES): Vit D, 25-Hydroxy: 41 ng/mL (ref 30–100)

## 2019-02-26 LAB — T3, FREE: T3, Free: 4.8 pg/mL — ABNORMAL HIGH (ref 2.3–4.2)

## 2019-02-26 NOTE — Progress Notes (Signed)
Please see your results.  Your kidney function actually seems to be better so keep drinking plenty of water.  Cholesterol numbers are very reasonable.  Your vitamin D level could be better so I want you to start taking vitamin D3 at a dose of 10,000 units daily.  That will be 2 capsules/tablets every day.  If you have any questions, do not hesitate to contact me through my chart otherwise have a great weekend and I will see you next time, be well!

## 2019-03-11 ENCOUNTER — Ambulatory Visit (INDEPENDENT_AMBULATORY_CARE_PROVIDER_SITE_OTHER): Payer: Medicare Other

## 2019-03-11 ENCOUNTER — Other Ambulatory Visit: Payer: Self-pay

## 2019-03-11 DIAGNOSIS — Z23 Encounter for immunization: Secondary | ICD-10-CM | POA: Diagnosis not present

## 2019-03-16 DIAGNOSIS — L570 Actinic keratosis: Secondary | ICD-10-CM | POA: Diagnosis not present

## 2019-03-16 DIAGNOSIS — D485 Neoplasm of uncertain behavior of skin: Secondary | ICD-10-CM | POA: Diagnosis not present

## 2019-03-16 DIAGNOSIS — L119 Acantholytic disorder, unspecified: Secondary | ICD-10-CM | POA: Diagnosis not present

## 2019-03-16 DIAGNOSIS — L820 Inflamed seborrheic keratosis: Secondary | ICD-10-CM | POA: Diagnosis not present

## 2019-03-16 DIAGNOSIS — D044 Carcinoma in situ of skin of scalp and neck: Secondary | ICD-10-CM | POA: Diagnosis not present

## 2019-03-25 DIAGNOSIS — L570 Actinic keratosis: Secondary | ICD-10-CM | POA: Diagnosis not present

## 2019-03-25 DIAGNOSIS — C4442 Squamous cell carcinoma of skin of scalp and neck: Secondary | ICD-10-CM | POA: Diagnosis not present

## 2019-03-29 ENCOUNTER — Telehealth (INDEPENDENT_AMBULATORY_CARE_PROVIDER_SITE_OTHER): Payer: Self-pay

## 2019-03-29 ENCOUNTER — Other Ambulatory Visit (INDEPENDENT_AMBULATORY_CARE_PROVIDER_SITE_OTHER): Payer: Self-pay | Admitting: Internal Medicine

## 2019-03-29 MED ORDER — TADALAFIL 5 MG PO TABS: 5.0000 mg | ORAL_TABLET | Freq: Every day | ORAL | 6 refills | Status: DC

## 2019-04-22 ENCOUNTER — Telehealth (INDEPENDENT_AMBULATORY_CARE_PROVIDER_SITE_OTHER): Payer: Self-pay

## 2019-04-22 ENCOUNTER — Other Ambulatory Visit (INDEPENDENT_AMBULATORY_CARE_PROVIDER_SITE_OTHER): Payer: Self-pay | Admitting: Internal Medicine

## 2019-04-22 MED ORDER — TADALAFIL 5 MG PO TABS: 5.0000 mg | ORAL_TABLET | Freq: Every day | ORAL | 6 refills | Status: DC

## 2019-04-27 NOTE — Telephone Encounter (Signed)
Sent express scripted ehr.

## 2019-05-04 ENCOUNTER — Other Ambulatory Visit (INDEPENDENT_AMBULATORY_CARE_PROVIDER_SITE_OTHER): Payer: Self-pay | Admitting: Internal Medicine

## 2019-06-17 ENCOUNTER — Ambulatory Visit (INDEPENDENT_AMBULATORY_CARE_PROVIDER_SITE_OTHER): Payer: Medicare Other | Admitting: Internal Medicine

## 2019-06-17 ENCOUNTER — Other Ambulatory Visit: Payer: Self-pay

## 2019-06-17 ENCOUNTER — Encounter (INDEPENDENT_AMBULATORY_CARE_PROVIDER_SITE_OTHER): Payer: Self-pay | Admitting: Internal Medicine

## 2019-06-17 VITALS — BP 141/85 | HR 86 | Temp 97.6°F | Ht 69.0 in | Wt 203.6 lb

## 2019-06-17 DIAGNOSIS — E559 Vitamin D deficiency, unspecified: Secondary | ICD-10-CM

## 2019-06-17 DIAGNOSIS — E782 Mixed hyperlipidemia: Secondary | ICD-10-CM | POA: Diagnosis not present

## 2019-06-17 DIAGNOSIS — I10 Essential (primary) hypertension: Secondary | ICD-10-CM | POA: Diagnosis not present

## 2019-06-17 DIAGNOSIS — E291 Testicular hypofunction: Secondary | ICD-10-CM | POA: Diagnosis not present

## 2019-06-17 NOTE — Progress Notes (Signed)
Chief Complaint: This very pleasant 69 year old man comes in for an annual physical exam and to address his chronic conditions which are described below. HPI: Overall, he is doing well.  He has a history of hypertension but does not require antihypertensive medications usually.  When he gets very anxious and stressed, his blood pressure tends to go up but then it comes down. He continues with testosterone therapy twice a week without any problems. He also takes vitamin D3 supplementation for vitamin D deficiency. He also continues with desiccated thyroid, off label, for symptoms of thyroid deficiency.  Past Medical History:  Diagnosis Date  . BPH (benign prostatic hyperplasia)   . Essential hypertension, benign 02/25/2019  . HLD (hyperlipidemia) 02/25/2019  . Hypertension   . Hypothyroidism, adult 02/25/2019  . Testicular failure 02/25/2019  . Vitamin D deficiency disease 02/25/2019   Past Surgical History:  Procedure Laterality Date  . HAND TENDON SURGERY    . 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    Social History   Tobacco Use  . Smoking status: Former Smoker    Packs/day: 1.00    Years: 45.00    Pack years: 45.00    Types: Cigarettes    Start date: 09/15/1970    Quit date: 12/09/2015    Years since quitting: 3.5  . Smokeless tobacco: Current User    Types: Snuff  . Tobacco comment: has been dipping snuff for 30 years  Substance Use Topics  . Alcohol use: Not Currently      Allergies: No Known Allergies   Current Meds  Medication Sig  . Calcium-Magnesium-Zinc 167-83-8 MG TABS Take 1 tablet by mouth daily.  . Cholecalciferol (VITAMIN D3) 5000 units CAPS Take 1 capsule by mouth daily.  . cyanocobalamin 2000 MCG tablet Take 2,000 mcg by mouth daily.  . NP THYROID 90 MG tablet TAKE 1 TABLET DAILY  . tadalafil (CIALIS) 5 MG tablet Take 1 tablet (5 mg total) by mouth  daily.  Marland Kitchen testosterone cypionate (DEPOTESTOSTERONE CYPIONATE) 200 MG/ML injection Inject 100 mg into the muscle 2 (two) times a week. For IM use only   . [DISCONTINUED] levothyroxine (SYNTHROID, LEVOTHROID) 25 MCG tablet Take 25 mcg by mouth daily before breakfast.       Bio-identical hormones Testosterone therapy is being used off label for symptoms of testosterone deficiency and benefits that it produces based on several studies.  These benefits include decreasing body fat, increasing in lean muscle mass and increasing in bone density.  There is improvement of memory, cognition.  There is improvement in exercise tolerance and endurance.  Testosterone therapy has also been shown to be protective against coronary artery disease, cerebrovascular disease, diabetes, hypertension and degenerative joint disease. I have discussed with the patient the FDA warnings regarding testosterone therapy, benefits and side effects and modes of administration as well as monitoring blood levels and side effects  on a regular basis The patient is agreeable that testosterone therapy should be an integral part of his/her wellness,quality of life and prevention of chronic disease.  This patient is being treated with desiccated thyroid, off label, for symptoms of thyroid deficiency.  The patient has been counseled regarding side effects and how to deal with them.  [id number] from the symptoms mentioned above,there are no other symptoms referable to all systems reviewed.  Physical Exam: Blood pressure (!) 141/85, pulse 86, temperature 97.6 F (36.4 C),  temperature source Temporal, height 5\' 9"  (1.753 m), weight 203 lb 9.6 oz (92.4 kg), SpO2 98 %. Vitals with BMI 06/17/2019 02/25/2019 01/14/2017  Height 5\' 9"  5\' 9"  -  Weight 203 lbs 10 oz 196 lbs -  BMI [id number] [id number] -  Systolic [id number]  Diastolic 85 70 80  Pulse 86 72 71      He looks systemically well.  Blood pressure slightly elevated today but he is very upset  about the events in [place] yesterday. General: Alert, cooperative, and appears to be the stated age.No pallor.  No jaundice.  No clubbing. Head: Normocephalic Eyes: Sclera white, pupils equal and reactive to light, red reflex x 2,  Ears: Normal bilaterally Oral cavity: Lips, mucosa, and tongue normal: Teeth and gums normal Neck: No adenopathy, supple, symmetrical, trachea midline, and thyroid does not appear enlarged Respiratory: Clear to auscultation bilaterally.No wheezing, crackles or bronchial breathing. Cardiovascular: Heart sounds are present and appear to be normal without murmurs or added sounds.  No carotid bruits.  Peripheral pulses are present and equal bilaterally.: Gastrointestinal:positive bowel sounds, no hepatosplenomegaly.  No masses felt.No tenderness. Skin: Clear, No rashes noted.No worrisome skin lesions seen. Neurological: Grossly intact without focal findings, cranial nerves II through XII intact, muscle strength equal bilaterally Musculoskeletal: No acute joint abnormalities noted.Full range of movement noted with joints. Psychiatric: Affect appropriate, non-anxious.    Assessment  1. Testicular failure   2. Essential hypertension, benign   3. Vitamin D deficiency disease   4. Mixed hyperlipidemia     Tests Ordered:  No orders of the defined types were placed in this encounter.    Plan  1. He will continue with all medications for his chronic conditions above. 2. I encouraged him to make sure he is well-hydrated as his creatinine was somewhat elevated on the last visit. 3. I will see him in 6 months for follow-up. 4. Today I spent 45 minutes with this patient reviewing all his medications and performing a overall evaluation.     No orders of the defined types were placed in this encounter.    Mellony Danziger C Alainna Stawicki   06/17/2019, 9:33 AM

## 2019-06-24 ENCOUNTER — Other Ambulatory Visit (INDEPENDENT_AMBULATORY_CARE_PROVIDER_SITE_OTHER): Payer: Self-pay

## 2019-06-24 ENCOUNTER — Other Ambulatory Visit (INDEPENDENT_AMBULATORY_CARE_PROVIDER_SITE_OTHER): Payer: Self-pay | Admitting: Internal Medicine

## 2019-06-24 MED ORDER — TESTOSTERONE CYPIONATE 200 MG/ML IM SOLN: 100.0000 mg | INTRAMUSCULAR | 2 refills | Status: DC

## 2019-07-23 ENCOUNTER — Ambulatory Visit: Payer: Medicare Other | Attending: Internal Medicine

## 2019-07-23 DIAGNOSIS — Z23 Encounter for immunization: Secondary | ICD-10-CM | POA: Insufficient documentation

## 2019-07-23 NOTE — Progress Notes (Signed)
   Covid-19 Vaccination Clinic  Name:  Edwin Perez    MRN: [id number] DOB: 1951-02-13  07/23/2019  Mr. Edwin Perez was observed post Covid-19 immunization for 15 minutes without incidence. He was provided with Vaccine Information Sheet and instruction to access the V-Safe system.   Mr. Edwin Perez was instructed to call 911 with any severe reactions post vaccine: Marland Kitchen Difficulty breathing  . Swelling of your face and throat  . A fast heartbeat  . A bad rash all over your body  . Dizziness and weakness    Immunizations Administered    Name Date Dose VIS Date Route   Pfizer COVID-19 Vaccine 07/23/2019  4:08 PM 0.3 mL 05/21/2019 Intramuscular   Manufacturer: [place]   Lot: [id number]   [place]: [id number]

## 2019-08-02 ENCOUNTER — Other Ambulatory Visit (INDEPENDENT_AMBULATORY_CARE_PROVIDER_SITE_OTHER): Payer: Self-pay | Admitting: Internal Medicine

## 2019-08-15 ENCOUNTER — Ambulatory Visit: Payer: Medicare Other | Attending: Internal Medicine

## 2019-08-15 DIAGNOSIS — Z23 Encounter for immunization: Secondary | ICD-10-CM | POA: Insufficient documentation

## 2019-08-15 NOTE — Progress Notes (Signed)
   Covid-19 Vaccination Clinic  Name:  Davarion Safarian    MRN: [id number] DOB: 10-11-1950  08/15/2019  Mr. Negro was observed post Covid-19 immunization for 15 minutes without incident. He was provided with Vaccine Information Sheet and instruction to access the V-Safe system.   Mr. Dasilva was instructed to call 911 with any severe reactions post vaccine: Marland Kitchen Difficulty breathing  . Swelling of face and throat  . A fast heartbeat  . A bad rash all over body  . Dizziness and weakness   Immunizations Administered    Name Date Dose VIS Date Route   Pfizer COVID-19 Vaccine 08/15/2019  9:59 AM 0.3 mL 05/21/2019 Intramuscular   Manufacturer: [place]   Lot: [id number]   [place]: [id number]

## 2019-09-22 DIAGNOSIS — L570 Actinic keratosis: Secondary | ICD-10-CM | POA: Diagnosis not present

## 2019-09-22 DIAGNOSIS — D0439 Carcinoma in situ of skin of other parts of face: Secondary | ICD-10-CM | POA: Diagnosis not present

## 2019-09-22 DIAGNOSIS — D485 Neoplasm of uncertain behavior of skin: Secondary | ICD-10-CM | POA: Diagnosis not present

## 2019-09-30 DIAGNOSIS — C44329 Squamous cell carcinoma of skin of other parts of face: Secondary | ICD-10-CM | POA: Diagnosis not present

## 2019-11-22 ENCOUNTER — Other Ambulatory Visit (INDEPENDENT_AMBULATORY_CARE_PROVIDER_SITE_OTHER): Payer: Self-pay

## 2019-11-22 MED ORDER — TADALAFIL 5 MG PO TABS: 5.0000 mg | ORAL_TABLET | Freq: Every day | ORAL | 6 refills | Status: DC

## 2019-11-23 ENCOUNTER — Emergency Department (HOSPITAL_COMMUNITY)
Admission: EM | Admit: 2019-11-23 | Discharge: 2019-11-23 | Disposition: A | Discharge status: Home / Self Care | Payer: Medicare Other | Attending: Emergency Medicine | Admitting: Emergency Medicine

## 2019-11-23 ENCOUNTER — Emergency Department (HOSPITAL_COMMUNITY): Payer: Medicare Other

## 2019-11-23 ENCOUNTER — Other Ambulatory Visit: Payer: Self-pay

## 2019-11-23 ENCOUNTER — Encounter (HOSPITAL_COMMUNITY): Payer: Self-pay | Admitting: Emergency Medicine

## 2019-11-23 DIAGNOSIS — Y9371 Activity, boxing: Secondary | ICD-10-CM | POA: Insufficient documentation

## 2019-11-23 DIAGNOSIS — Y9289 Other specified places as the place of occurrence of the external cause: Secondary | ICD-10-CM | POA: Insufficient documentation

## 2019-11-23 DIAGNOSIS — E039 Hypothyroidism, unspecified: Secondary | ICD-10-CM | POA: Diagnosis not present

## 2019-11-23 DIAGNOSIS — X509XXA Other and unspecified overexertion or strenuous movements or postures, initial encounter: Secondary | ICD-10-CM | POA: Diagnosis not present

## 2019-11-23 DIAGNOSIS — I10 Essential (primary) hypertension: Secondary | ICD-10-CM | POA: Insufficient documentation

## 2019-11-23 DIAGNOSIS — Z87891 Personal history of nicotine dependence: Secondary | ICD-10-CM | POA: Diagnosis not present

## 2019-11-23 DIAGNOSIS — M79604 Pain in right leg: Secondary | ICD-10-CM | POA: Diagnosis not present

## 2019-11-23 DIAGNOSIS — Y999 Unspecified external cause status: Secondary | ICD-10-CM | POA: Insufficient documentation

## 2019-11-23 DIAGNOSIS — S86111A Strain of other muscle(s) and tendon(s) of posterior muscle group at lower leg level, right leg, initial encounter: Secondary | ICD-10-CM | POA: Diagnosis not present

## 2019-11-23 DIAGNOSIS — I739 Peripheral vascular disease, unspecified: Secondary | ICD-10-CM | POA: Diagnosis not present

## 2019-11-23 MED ORDER — NAPROXEN 500 MG PO TABS
500.0000 mg | ORAL_TABLET | Freq: Two times a day (BID) | ORAL | 0 refills | Status: AC | PRN
Start: 2019-11-23 — End: 2019-12-03

## 2019-11-23 NOTE — ED Provider Notes (Signed)
[facility] EMERGENCY DEPARTMENT Provider Note   CSN: [id number] Arrival date & time: 11/23/19  1742     History Chief Complaint  Patient presents with  . Leg Pain    Edwin Perez is a 69 y.o. male with PMH of HTN and HLD who presents to the ED with acute onset right leg pain.  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  He recently restarted his program a few weeks ago and was participating in a high intensity interval training session this afternoon when he felt a sudden onset of pain in his right calf.  Patient reports that he was performing right-sided knee strikes when he felt significant, 8 out of 10 pain in the area of his medial gastrocnemius head.  He reports that he has had significant discomfort with any ambulation or weightbearing on the affected leg.  He also has pain with dorsiflexion.  He denies any numbness or weakness.  No bony injury.  No twisting or fall.  No other injury.  He states that he has never had this kind of discomfort before.  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  HPI     Past Medical History:  Diagnosis Date  . BPH (benign prostatic hyperplasia)   . Essential hypertension, benign 02/25/2019  . HLD (hyperlipidemia) 02/25/2019  . Hypertension   . Hypothyroidism, adult 02/25/2019  . Testicular failure 02/25/2019  . Vitamin D deficiency disease 02/25/2019    Patient Active Problem List   Diagnosis Date Noted  . Essential hypertension, benign 02/25/2019  . Testicular failure 02/25/2019  . Hypothyroidism, adult 02/25/2019  . Vitamin D deficiency disease 02/25/2019  . HLD (hyperlipidemia) 02/25/2019    Past Surgical History:  Procedure Laterality Date  . HAND TENDON SURGERY    . TONSILLECTOMY         No family history on file.  Social History   Tobacco Use  . Smoking status: Former Smoker    Packs/day: 1.00    Years:  45.00    Pack years: 45.00    Types: Cigarettes    Start date: 09/15/1970    Quit date: 12/09/2015    Years since quitting: 3.9  . Smokeless tobacco: Current User    Types: Snuff  . Tobacco comment: has been dipping snuff for 30 years  Substance Use Topics  . Alcohol use: Not Currently  . Drug use: No    Home Medications Prior to Admission medications   Medication Sig Start Date End Date Taking? Authorizing Provider  Calcium-Magnesium-Zinc [phone] MG TABS Take 1 tablet by mouth daily.    [provider]  Cholecalciferol (VITAMIN D3) 5000 units CAPS Take 1 capsule by mouth daily.    [provider]  cyanocobalamin 2000 MCG tablet Take 2,000 mcg by mouth daily.    [provider]  naproxen (NAPROSYN) 500 MG tablet Take 1 tablet (500 mg total) by mouth 2 (two) times daily as needed for up to 10 days for moderate pain. 11/23/19 12/03/19  Corena Herter, PA-C  NP THYROID 90 MG tablet TAKE 1 TABLET DAILY 08/02/19 10/31/19  Hurshel Party C, MD  tadalafil (CIALIS) 5 MG tablet Take 1 tablet (5 mg total) by mouth daily. 11/22/19   Doree Albee, MD  testosterone cypionate (DEPOTESTOSTERONE CYPIONATE) 200 MG/ML injection Inject 0.5 mLs (100 mg total) into the muscle 2 (two) times a week. 06/24/19  Doree Albee, MD  NP THYROID 90 MG tablet Take 90 mg by mouth daily.    [provider]  NP THYROID 90 MG tablet TAKE 1 TABLET DAILY 05/04/19   Doree Albee, MD    Allergies    Patient has no known allergies.  Review of Systems   Review of Systems  Musculoskeletal: Positive for gait problem and myalgias. Negative for arthralgias.  Skin: Negative for color change and wound.  Neurological: Negative for weakness and numbness.  Hematological: Does not bruise/bleed easily.    Physical Exam Updated Vital Signs BP 125/78 (BP Location: Right Arm)   Pulse 83   Temp 97.9 F (36.6 C) (Oral)   Resp 18   Ht 5\' 9"  (1.753 m)   Wt 84.4 kg   SpO2 97%   BMI  27.47 kg/m   Physical Exam Vitals and nursing note reviewed. Exam conducted with a chaperone present.  Constitutional:      Appearance: Normal appearance. He is not ill-appearing.  HENT:     Head: Normocephalic and atraumatic.  Eyes:     General: No scleral icterus.    Conjunctiva/sclera: Conjunctivae normal.  Cardiovascular:     Rate and Rhythm: Normal rate and regular rhythm.     Pulses: Normal pulses.     Heart sounds: Normal heart sounds.  Pulmonary:     Effort: Pulmonary effort is normal.  Musculoskeletal:     Comments: Right leg: No obvious swelling, erythema, ecchymoses, or other overlying skin changes appreciated.  Focal area of tenderness over medial aspect gastrocnemius.  No popliteal tenderness or mass appreciated.  Negative Thompson's test.  No Achilles tenderness.  Pedal pulse intact.  Sensation intact throughout.  Tenderness elicited with active dorsiflexion.  No significant tenderness with plantar flexion.  ROM of hip, knee, and ankle fully intact.  Strength intact throughout.  Pain with weightbearing.  Scattered varicosities noted.  Skin:    General: Skin is dry.     Capillary Refill: Capillary refill takes less than 2 seconds.  Neurological:     Mental Status: He is alert and oriented to person, place, and time.     GCS: GCS eye subscore is 4. GCS verbal subscore is 5. GCS motor subscore is 6.  Psychiatric:        Mood and Affect: Mood normal.        Behavior: Behavior normal.        Thought Content: Thought content normal.     ED Results / Procedures / Treatments   Labs (all labs ordered are listed, but only abnormal results are displayed) Labs Reviewed - No data to display  EKG None  Radiology DG Tibia/Fibula Right Port  Result Date: 11/23/2019 CLINICAL DATA:  Right leg pain EXAM: PORTABLE RIGHT TIBIA AND FIBULA - 2 VIEW COMPARISON:  None. FINDINGS: There is no evidence of fracture or other focal bone lesions. Soft tissues are unremarkable. IMPRESSION:  Negative. Electronically Signed   By: Rolm Baptise M.D.   On: 11/23/2019 19:22    Procedures Procedures (including critical care time)  Medications Ordered in ED Medications - No data to display  ED Course  I have reviewed the triage vital signs and the nursing notes.  Pertinent labs & imaging results that were available during my care of the patient were reviewed by me and considered in my medical decision making (see chart for details).    MDM Rules/Calculators/A&P  Patient presents the ED with history and physical exam consistent with a medial-anemias strain (MGS).  Suspect that patient has a partial or total rupture within medial body of gastrocnemius on his right leg.  Will provide patient with crutches and referred to Dr. Aline Brochure, local orthopedist, for ongoing evaluation and management.  Patient may ultimately require MRI should his symptoms fail to improve with conservative therapy.  While patient has scattered varicosities, there was a clear mechanism of injury for his gas anemia strain I have low suspicion for a DVT at this time.  His physical exam is relatively benign and there is no significant swelling or overlying skin changes appreciated.  Encouraging patient to adhere to RICE management.  We will prescribe patient a course of naproxen and encouraged him to keep leg elevated.  I informed patient that recovery can take 6 weeks or more depending on the degree of strain.  I will also encourage him to participate in light stretching exercises regularly to help contribute to muscle healing.  Plain films were obtained of tibia/fibula from triage which were personally reviewed and negative for any bony abnormalities.  Strict ED return precautions discussed with patient.  All of the evaluation and work-up results were discussed with the patient and any family at bedside. They were provided opportunity to ask any additional questions and have none at this time. They  have expressed understanding of verbal discharge instructions as well as return precautions and are agreeable to the plan.    Final Clinical Impression(s) / ED Diagnoses Final diagnoses:  Claudication ([place])  Gastrocnemius strain, right, initial encounter    Rx / DC Orders ED Discharge Orders         Ordered    naproxen (NAPROSYN) 500 MG tablet  2 times daily PRN     Discontinue  Reprint     11/23/19 2256           Corena Herter, PA-C 11/23/19 2310    Milton Ferguson, MD 11/24/19 1013

## 2019-11-23 NOTE — ED Triage Notes (Signed)
Pt complains of rt leg pain resulting from working out today. Patient states it is almost like a cramping sensation in his rt calf, hurts more with ambulation. Bilateral pedal pulses intact.

## 2019-11-23 NOTE — Discharge Instructions (Addendum)
Please read the attachment on medial head gastrocnemius tear.  Please take the naproxen, as directed.  Do not combine with other NSAIDs.  Discontinue immediately should you notice any dark black stools.  Please call the office of Dr. Arther Abbott, orthopedics, schedule appointment for ongoing evaluation and management.  You will need to continue using crutches in the interim.  Weightbearing as tolerated.  I recommend light stretching exercises.  Please keep the leg elevated when not using it.  Please also notify your primary care provider regarding today's encounter.  Return to the ED or seek immediate medical attention should you experience any new or worsening symptoms.

## 2019-11-24 ENCOUNTER — Ambulatory Visit (INDEPENDENT_AMBULATORY_CARE_PROVIDER_SITE_OTHER): Payer: Medicare Other | Admitting: Orthopedic Surgery

## 2019-11-24 VITALS — BP 136/82 | HR 79 | Ht 69.0 in | Wt 186.0 lb

## 2019-11-24 DIAGNOSIS — S86811A Strain of other muscle(s) and tendon(s) at lower leg level, right leg, initial encounter: Secondary | ICD-10-CM

## 2019-11-24 NOTE — Progress Notes (Signed)
NEW PROBLEM//OFFICE VISIT  Chief Complaint  Patient presents with  . Leg Pain    ER follow up pain on right calf pain, DOI 11-23-19.    69 year old male who very active in very good shape was at a [employer] type boxing place planted his right foot foot felt a sharp pain in his right calf and presents with pain in his right calf.  Now on crutches   Review of Systems  Constitutional: Negative for chills and fever.  Neurological: Negative for tingling and tremors.     Past Medical History:  Diagnosis Date  . BPH (benign prostatic hyperplasia)   . Essential hypertension, benign 02/25/2019  . HLD (hyperlipidemia) 02/25/2019  . Hypertension   . Hypothyroidism, adult 02/25/2019  . Testicular failure 02/25/2019  . Vitamin D deficiency disease 02/25/2019    Past Surgical History:  Procedure Laterality Date  . HAND TENDON SURGERY    . TONSILLECTOMY      No family history on file. Social History   Tobacco Use  . Smoking status: Former Smoker    Packs/day: 1.00    Years: 45.00    Pack years: 45.00    Types: Cigarettes    Start date: 09/15/1970    Quit date: 12/09/2015    Years since quitting: 3.9  . Smokeless tobacco: Current User    Types: Snuff  . Tobacco comment: has been dipping snuff for 30 years  Substance Use Topics  . Alcohol use: Not Currently  . Drug use: No    No Known Allergies  Current Meds  Medication Sig  . Calcium-Magnesium-Zinc 167-83-8 MG TABS Take 1 tablet by mouth daily.  . Cholecalciferol (VITAMIN D3) 5000 units CAPS Take 1 capsule by mouth daily.  . cyanocobalamin 2000 MCG tablet Take 2,000 mcg by mouth daily.  . tadalafil (CIALIS) 5 MG tablet Take 1 tablet (5 mg total) by mouth daily.  Marland Kitchen testosterone cypionate (DEPOTESTOSTERONE CYPIONATE) 200 MG/ML injection Inject 0.5 mLs (100 mg total) into the muscle 2 (two) times a week.    BP 136/82   Pulse 79   Ht 5\' 9"  (1.753 m)   Wt 186 lb (84.4 kg)   BMI 27.47 kg/m   Physical Exam Constitutional:       General: He is not in acute distress.    Appearance: He is well-developed.  Cardiovascular:     Comments: No peripheral edema Skin:    General: Skin is warm and dry.  Neurological:     Mental Status: He is alert and oriented to person, place, and time.     Sensory: No sensory deficit.     Coordination: Coordination normal.     Gait: Gait abnormal.     Deep Tendon Reflexes: Reflexes are normal and symmetric.     Ortho Exam  Right calf tenderness.  Achilles tendon is intact painful range of motion with passive dorsiflexion of the foot.  Neurovascular Sam is intact  No bruising on the skin.  Patient is limping with crutches  MEDICAL DECISION MAKING  A.  Encounter Diagnosis  Name Primary?  . Strain of calf muscle, right, initial encounter Yes    B. DATA ANALYSED:    IMAGING: Independent interpretation of images:   Orders:   Outside records reviewed:  C. MANAGEMENT   Symptomatic treatment ice and then heat stretching exercises weight-bear as tolerated  3 to 4 weeks should recover well follow-up as needed  No orders of the defined types were placed in this encounter.  Arther Abbott, MD  11/24/2019 11:09 AM

## 2019-11-24 NOTE — Patient Instructions (Signed)
Ice for 48 hours then heat active range of motion and then stretching can be started at 2 weeks

## 2019-12-16 ENCOUNTER — Ambulatory Visit (INDEPENDENT_AMBULATORY_CARE_PROVIDER_SITE_OTHER): Payer: Medicare Other | Admitting: Internal Medicine

## 2019-12-16 ENCOUNTER — Other Ambulatory Visit: Payer: Self-pay

## 2019-12-16 ENCOUNTER — Encounter (INDEPENDENT_AMBULATORY_CARE_PROVIDER_SITE_OTHER): Payer: Self-pay | Admitting: Internal Medicine

## 2019-12-16 VITALS — BP 120/66 | HR 78 | Temp 97.5°F | Resp 18 | Ht 69.0 in | Wt 192.8 lb

## 2019-12-16 DIAGNOSIS — E039 Hypothyroidism, unspecified: Secondary | ICD-10-CM | POA: Diagnosis not present

## 2019-12-16 DIAGNOSIS — E782 Mixed hyperlipidemia: Secondary | ICD-10-CM | POA: Diagnosis not present

## 2019-12-16 DIAGNOSIS — N401 Enlarged prostate with lower urinary tract symptoms: Secondary | ICD-10-CM

## 2019-12-16 DIAGNOSIS — E291 Testicular hypofunction: Secondary | ICD-10-CM | POA: Diagnosis not present

## 2019-12-16 NOTE — Progress Notes (Signed)
Metrics: Intervention Frequency ACO  Documented Smoking Status Yearly  Screened one or more times in 24 months  Cessation Counseling or  Active cessation medication Past 24 months  Past 24 months   Guideline developer: UpToDate (See UpToDate for funding source) Date Released: 2014       Wellness Office Visit  Subjective:  Patient ID: Edwin Perez, male    DOB: [birth year]  Age: 69 y.o. MRN: [id number]  CC: This man comes in for follow-up of hypothyroidism, hyperlipidemia, hypogonadism. HPI  He is doing very well except he had a muscle injury on the right calf whilst he was boxing.  He is trying to get back into the gym and feeling healthier now. He continues on testosterone therapy as before and his levels were in a good range.  He is tolerating this well. He does have some BPH which is usually helped significantly with Cialis.  Occasionally he gets into trouble with symptoms. He continues on desiccated NP thyroid and his T3 levels were optimal the last time I checked them. Previously, before the injury to the right lower leg, he was mainly eating a plant-based diet and is going to go back onto this once he is feeling better. Past Medical History:  Diagnosis Date  . BPH (benign prostatic hyperplasia)   . Essential hypertension, benign 02/25/2019  . HLD (hyperlipidemia) 02/25/2019  . Hypertension   . Hypothyroidism, adult 02/25/2019  . Testicular failure 02/25/2019  . Vitamin D deficiency disease 02/25/2019   Past Surgical History:  Procedure Laterality Date  . HAND TENDON SURGERY    . TONSILLECTOMY       History reviewed. No pertinent family history.  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   Social History   Tobacco Use  . Smoking status: Former Smoker    Packs/day: 1.00    Years: 45.00    Pack years: 45.00    Types: Cigarettes    Start date: 09/15/1970    Quit date: 12/09/2015    Years since  quitting: 4.0  . Smokeless tobacco: Current User    Types: Snuff  . Tobacco comment: has been dipping snuff for 30 years  Substance Use Topics  . Alcohol use: Not Currently    Current Meds  Medication Sig  . Calcium-Magnesium-Zinc 167-83-8 MG TABS Take 1 tablet by mouth daily.  . Cholecalciferol (VITAMIN D3) 5000 units CAPS Take 1 capsule by mouth daily.  . cyanocobalamin 2000 MCG tablet Take 2,000 mcg by mouth daily.  . NP THYROID 90 MG tablet TAKE 1 TABLET DAILY  . tadalafil (CIALIS) 5 MG tablet Take 1 tablet (5 mg total) by mouth daily.  Marland Kitchen testosterone cypionate (DEPOTESTOSTERONE CYPIONATE) 200 MG/ML injection Inject 0.5 mLs (100 mg total) into the muscle 2 (two) times a week.       Depression screen PHQ 2/9 06/17/2019  Decreased Interest 0  Down, Depressed, Hopeless 0  PHQ - 2 Score 0     Objective:   Today's Vitals: BP 120/66 (BP Location: Right Arm, Patient Position: Sitting, Cuff Size: Normal)   Pulse 78   Temp (!) 97.5 F (36.4 C) (Temporal)   Resp 18   Ht 5\' 9"  (1.753 m)   Wt 192 lb 12.8 oz (87.5 kg)   SpO2 98%   BMI 28.47 kg/m  Vitals with BMI 12/16/2019 11/24/2019 11/23/2019  Height 5\' 9"  5\' 9"  5\' 9"   Weight 192 lbs 13 oz 186 lbs 186  lbs  BMI 28.46 50.72 25.75  Systolic 051 833 582  Diastolic 66 82 78  Pulse 78 79 83     Physical Exam   He looks systemically well.  Blood pressure is excellent.  No new physical findings.    Assessment   1. Testicular failure   2. Hypothyroidism, adult   3. Mixed hyperlipidemia   4. Benign prostatic hyperplasia with lower urinary tract symptoms, symptom details unspecified       Tests ordered Orders Placed This Encounter  Procedures  . PSA, Total with Reflex to PSA, Free  . Estradiol     Plan: 1. I am going to check his PSA and make sure that this is okay.  His BPH symptoms are reasonably well controlled and he will continue with Cialis. 2. He will continue with testosterone therapy for hypogonadism and  is tolerating this well. 3. He will continue with desiccated NP thyroid for his hypothyroidism and is tolerating this well. 4. Further recommendations will depend on the results and I will see him in 6 months time for an annual physical exam   No orders of the defined types were placed in this encounter.   Doree Albee, MD

## 2019-12-20 ENCOUNTER — Encounter (INDEPENDENT_AMBULATORY_CARE_PROVIDER_SITE_OTHER): Payer: Self-pay | Admitting: Internal Medicine

## 2019-12-21 LAB — ESTRADIOL: Estradiol: 66 pg/mL — ABNORMAL HIGH (ref <=39)

## 2019-12-21 LAB — PSA, TOTAL WITH REFLEX TO PSA, FREE

## 2019-12-29 LAB — PSA, TOTAL WITH REFLEX TO PSA, FREE: PSA, Total: 2.7 ng/mL (ref <=4.0)

## 2020-01-31 ENCOUNTER — Other Ambulatory Visit (INDEPENDENT_AMBULATORY_CARE_PROVIDER_SITE_OTHER): Payer: Self-pay | Admitting: Internal Medicine

## 2020-01-31 MED ORDER — TESTOSTERONE CYPIONATE 200 MG/ML IM SOLN: 100.0000 mg | INTRAMUSCULAR | 2 refills | Status: DC

## 2020-02-02 ENCOUNTER — Other Ambulatory Visit (INDEPENDENT_AMBULATORY_CARE_PROVIDER_SITE_OTHER): Payer: Self-pay

## 2020-02-03 MED ORDER — TESTOSTERONE CYPIONATE 200 MG/ML IM SOLN: 100.0000 mg | INTRAMUSCULAR | 2 refills | Status: DC

## 2020-02-15 ENCOUNTER — Other Ambulatory Visit: Payer: Self-pay

## 2020-02-15 ENCOUNTER — Encounter (INDEPENDENT_AMBULATORY_CARE_PROVIDER_SITE_OTHER): Payer: Self-pay | Admitting: Internal Medicine

## 2020-02-15 ENCOUNTER — Ambulatory Visit (INDEPENDENT_AMBULATORY_CARE_PROVIDER_SITE_OTHER): Payer: Medicare Other | Admitting: Internal Medicine

## 2020-02-15 VITALS — BP 115/75 | HR 71 | Temp 97.7°F | Ht 69.0 in | Wt 192.6 lb

## 2020-02-15 DIAGNOSIS — E291 Testicular hypofunction: Secondary | ICD-10-CM

## 2020-02-15 NOTE — Progress Notes (Signed)
Metrics: Intervention Frequency ACO  Documented Smoking Status Yearly  Screened one or more times in 24 months  Cessation Counseling or  Active cessation medication Past 24 months  Past 24 months   Guideline developer: UpToDate (See UpToDate for funding source) Date Released: 2014       Wellness Office Visit  Subjective:  Patient ID: Edwin Perez, male    DOB: 07-28-1950  Age: 69 y.o. MRN: [id number]  CC: This man comes in because he has some questions regarding strength training. HPI  He was considering entering a competition in which he knew that he would be tested for anabolic steroids.  He also wanted to show me strength training routine that he is starting to do which is basically power lifting. Past Medical History:  Diagnosis Date  . BPH (benign prostatic hyperplasia)   . Essential hypertension, benign 02/25/2019  . HLD (hyperlipidemia) 02/25/2019  . Hypertension   . Hypothyroidism, adult 02/25/2019  . Testicular failure 02/25/2019  . Vitamin D deficiency disease 02/25/2019   Past Surgical History:  Procedure Laterality Date  . HAND TENDON SURGERY    . TONSILLECTOMY       History reviewed. No pertinent family history.  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   Social History   Tobacco Use  . Smoking status: Former Smoker    Packs/day: 1.00    Years: 45.00    Pack years: 45.00    Types: Cigarettes    Start date: 09/15/1970    Quit date: 12/09/2015    Years since quitting: 4.1  . Smokeless tobacco: Current User    Types: Snuff  . Tobacco comment: has been dipping snuff for 30 years  Substance Use Topics  . Alcohol use: Not Currently    Current Meds  Medication Sig  . Calcium-Magnesium-Zinc 167-83-8 MG TABS Take 1 tablet by mouth daily.  . Cholecalciferol (VITAMIN D3) 5000 units CAPS Take 1 capsule by mouth daily.  . cyanocobalamin 2000 MCG tablet Take 2,000 mcg by mouth daily.  .  Omega-3 Fatty Acids (FISH OIL) 1000 MG CPDR Take 1,000 mg by mouth daily.  . tadalafil (CIALIS) 5 MG tablet Take 1 tablet (5 mg total) by mouth daily.  Marland Kitchen testosterone cypionate (DEPOTESTOSTERONE CYPIONATE) 200 MG/ML injection Inject 0.5 mLs (100 mg total) into the muscle 2 (two) times a week.      Depression screen PHQ 2/9 06/17/2019  Decreased Interest 0  Down, Depressed, Hopeless 0  PHQ - 2 Score 0     Objective:   Today's Vitals: BP 115/75 (BP Location: Left Arm, Patient Position: Sitting, Cuff Size: Normal)   Pulse 71   Temp 97.7 F (36.5 C) (Temporal)   Ht 5\' 9"  (1.753 m)   Wt 192 lb 9.6 oz (87.4 kg)   SpO2 99%   BMI 28.44 kg/m  Vitals with BMI 02/15/2020 12/16/2019 11/24/2019  Height 5\' 9"  5\' 9"  5\' 9"   Weight 192 lbs 10 oz 192 lbs 13 oz 186 lbs  BMI 28.43 66.06 30.16  Systolic 010 932 355  Diastolic 75 66 82  Pulse 71 78 79     Physical Exam  He looks systemically well.  No new physical findings.     Assessment   1. Testicular failure       Tests ordered No orders of the defined types were placed in this encounter.    Plan: 1. I recommended that he does not stop testosterone  therapy as far as his health is concerned overall. 2. We also discussed power lifting and I did recommend 48 hours rest between certain lifts such as squats.  This will allow time for the muscles to hypertrophy and rest. 3. Follow-up as scheduled.   No orders of the defined types were placed in this encounter.   Doree Albee, MD

## 2020-03-15 ENCOUNTER — Other Ambulatory Visit (INDEPENDENT_AMBULATORY_CARE_PROVIDER_SITE_OTHER): Payer: Self-pay | Admitting: Internal Medicine

## 2020-03-16 ENCOUNTER — Ambulatory Visit (INDEPENDENT_AMBULATORY_CARE_PROVIDER_SITE_OTHER): Payer: Medicare Other

## 2020-03-16 ENCOUNTER — Other Ambulatory Visit: Payer: Self-pay

## 2020-03-16 DIAGNOSIS — Z23 Encounter for immunization: Secondary | ICD-10-CM

## 2020-03-16 NOTE — Progress Notes (Signed)
Pt given high dose flu vaccine in the rt arm. Pt tolerated well; no complaints.

## 2020-03-28 DIAGNOSIS — D485 Neoplasm of uncertain behavior of skin: Secondary | ICD-10-CM | POA: Diagnosis not present

## 2020-03-28 DIAGNOSIS — L570 Actinic keratosis: Secondary | ICD-10-CM | POA: Diagnosis not present

## 2020-03-28 DIAGNOSIS — L918 Other hypertrophic disorders of the skin: Secondary | ICD-10-CM | POA: Diagnosis not present

## 2020-04-14 DIAGNOSIS — Z23 Encounter for immunization: Secondary | ICD-10-CM | POA: Diagnosis not present

## 2020-04-18 ENCOUNTER — Telehealth (INDEPENDENT_AMBULATORY_CARE_PROVIDER_SITE_OTHER): Payer: Self-pay

## 2020-04-18 ENCOUNTER — Other Ambulatory Visit (INDEPENDENT_AMBULATORY_CARE_PROVIDER_SITE_OTHER): Payer: Self-pay | Admitting: Internal Medicine

## 2020-04-18 MED ORDER — TESTOSTERONE CYPIONATE 200 MG/ML IM SOLN: 100.0000 mg | INTRAMUSCULAR | 2 refills | Status: DC

## 2020-04-18 NOTE — Telephone Encounter (Signed)
Patient called and left a voice message requesting a medication refill for the following:  testosterone cypionate (DEPOTESTOSTERONE CYPIONATE) 200 MG/ML injection Last filled 02/03/2020, 10 mL with 2 refills  Last OV 02/15/2020  Please send to [facility] per the patient.

## 2020-06-15 ENCOUNTER — Telehealth (INDEPENDENT_AMBULATORY_CARE_PROVIDER_SITE_OTHER): Payer: Self-pay

## 2020-06-15 ENCOUNTER — Other Ambulatory Visit (INDEPENDENT_AMBULATORY_CARE_PROVIDER_SITE_OTHER): Payer: Self-pay | Admitting: Internal Medicine

## 2020-06-15 MED ORDER — TADALAFIL 5 MG PO TABS: 5.0000 mg | ORAL_TABLET | Freq: Every day | ORAL | 6 refills | Status: DC

## 2020-06-15 NOTE — Telephone Encounter (Signed)
Patient called and requested a refill of the following medication:  tadalafil (CIALIS) 5 MG tablet  Last filled 11/22/2019, # 30 with 6 refills  Last OV 02/15/2020  Next OV 07/20/2020

## 2020-06-20 ENCOUNTER — Ambulatory Visit (INDEPENDENT_AMBULATORY_CARE_PROVIDER_SITE_OTHER): Payer: TRICARE For Life (TFL) | Admitting: Internal Medicine

## 2020-07-03 ENCOUNTER — Telehealth (INDEPENDENT_AMBULATORY_CARE_PROVIDER_SITE_OTHER): Payer: Self-pay

## 2020-07-03 ENCOUNTER — Other Ambulatory Visit (INDEPENDENT_AMBULATORY_CARE_PROVIDER_SITE_OTHER): Payer: Self-pay | Admitting: Internal Medicine

## 2020-07-03 MED ORDER — TESTOSTERONE CYPIONATE 200 MG/ML IM SOLN: 100.0000 mg | INTRAMUSCULAR | 2 refills | Status: DC

## 2020-07-03 NOTE — Telephone Encounter (Signed)
Patient called and left a voice message that he needs a refill of the following medication sent to [facility]:  testosterone cypionate (DEPOTESTOSTERONE CYPIONATE) 200 MG/ML injection Last filled 04/20/2020, 10 mL with 2 refills

## 2020-07-18 ENCOUNTER — Ambulatory Visit (INDEPENDENT_AMBULATORY_CARE_PROVIDER_SITE_OTHER): Payer: TRICARE For Life (TFL) | Admitting: Internal Medicine

## 2020-07-20 ENCOUNTER — Ambulatory Visit (INDEPENDENT_AMBULATORY_CARE_PROVIDER_SITE_OTHER): Payer: TRICARE For Life (TFL) | Admitting: Internal Medicine

## 2020-08-10 ENCOUNTER — Ambulatory Visit (INDEPENDENT_AMBULATORY_CARE_PROVIDER_SITE_OTHER): Payer: TRICARE For Life (TFL) | Admitting: Internal Medicine

## 2020-08-21 ENCOUNTER — Other Ambulatory Visit (INDEPENDENT_AMBULATORY_CARE_PROVIDER_SITE_OTHER): Payer: Self-pay | Admitting: Internal Medicine

## 2020-08-21 ENCOUNTER — Telehealth (INDEPENDENT_AMBULATORY_CARE_PROVIDER_SITE_OTHER): Payer: Self-pay

## 2020-08-21 MED ORDER — TESTOSTERONE CYPIONATE 200 MG/ML IM SOLN: 100.0000 mg | INTRAMUSCULAR | 2 refills | Status: DC

## 2020-08-21 NOTE — Telephone Encounter (Signed)
Patient stated that he needs a refill of his syringes for his testosterone injections sent to [facility].   I do not see listed on medication list.

## 2020-08-22 NOTE — Telephone Encounter (Signed)
Patient called and left a voice message and stated that he did not receive his syringes with needles but he did get a vial of his testosterone.  Can you send his syringes and needles to his pharmacy? Thank you!

## 2020-09-07 ENCOUNTER — Other Ambulatory Visit: Payer: Self-pay

## 2020-09-07 ENCOUNTER — Encounter (INDEPENDENT_AMBULATORY_CARE_PROVIDER_SITE_OTHER): Payer: Self-pay | Admitting: Internal Medicine

## 2020-09-07 ENCOUNTER — Ambulatory Visit (INDEPENDENT_AMBULATORY_CARE_PROVIDER_SITE_OTHER): Payer: Medicare Other | Admitting: Internal Medicine

## 2020-09-07 VITALS — BP 131/76 | HR 89 | Temp 97.6°F | Resp 18 | Ht 69.0 in | Wt 193.0 lb

## 2020-09-07 DIAGNOSIS — I10 Essential (primary) hypertension: Secondary | ICD-10-CM | POA: Diagnosis not present

## 2020-09-07 DIAGNOSIS — E782 Mixed hyperlipidemia: Secondary | ICD-10-CM | POA: Diagnosis not present

## 2020-09-07 DIAGNOSIS — Z1211 Encounter for screening for malignant neoplasm of colon: Secondary | ICD-10-CM

## 2020-09-07 DIAGNOSIS — N401 Enlarged prostate with lower urinary tract symptoms: Secondary | ICD-10-CM | POA: Diagnosis not present

## 2020-09-07 DIAGNOSIS — E559 Vitamin D deficiency, unspecified: Secondary | ICD-10-CM | POA: Diagnosis not present

## 2020-09-07 DIAGNOSIS — E039 Hypothyroidism, unspecified: Secondary | ICD-10-CM | POA: Diagnosis not present

## 2020-09-07 DIAGNOSIS — E291 Testicular hypofunction: Secondary | ICD-10-CM | POA: Diagnosis not present

## 2020-09-07 NOTE — Progress Notes (Signed)
Metrics: Intervention Frequency ACO  Documented Smoking Status Yearly  Screened one or more times in 24 months  Cessation Counseling or  Active cessation medication Past 24 months  Past 24 months   Guideline developer: UpToDate (See UpToDate for funding source) Date Released: 2014       Wellness Office Visit  Subjective:  Patient ID: Edwin Perez, male    DOB: September 29, 1950  Age: 70 y.o. MRN: [id number]  CC: This man comes in for follow-up of hypertension, hypothyroidism, hyperlipidemia and hypogonadism as well as vitamin D deficiency. HPI  He is doing very well.  He is on a program that he has been on for several months for strength training in the form of power lifting.  He is achieving many gains and feeling much better. He continues on testosterone therapy twice a week. He continues on NP thyroid for his hypothyroidism. His blood pressure has been stable without needing medications. He has mild hyperlipidemia which has not required any medications.  He is trying to have a plant-based diet now. Past Medical History:  Diagnosis Date  . BPH (benign prostatic hyperplasia)   . Essential hypertension, benign 02/25/2019  . HLD (hyperlipidemia) 02/25/2019  . Hypertension   . Hypothyroidism, adult 02/25/2019  . Testicular failure 02/25/2019  . Vitamin D deficiency disease 02/25/2019   Past Surgical History:  Procedure Laterality Date  . HAND TENDON SURGERY    . TONSILLECTOMY       History reviewed. No pertinent family history.  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   Social History   Tobacco Use  . Smoking status: Former Smoker    Packs/day: 1.00    Years: 45.00    Pack years: 45.00    Types: Cigarettes    Start date: 09/15/1970    Quit date: 12/09/2015    Years since quitting: 4.7  . Smokeless tobacco: Current User    Types: Snuff  . Tobacco comment: has been dipping snuff for 30 years  Substance  Use Topics  . Alcohol use: Not Currently    Current Meds  Medication Sig  . Calcium-Magnesium-Zinc 167-83-8 MG TABS Take 1 tablet by mouth daily.  . Cholecalciferol (VITAMIN D3) 5000 units CAPS Take 1 capsule by mouth daily.  . cyanocobalamin 2000 MCG tablet Take 2,000 mcg by mouth daily.  . NP THYROID 90 MG tablet TAKE 1 TABLET DAILY  . Omega-3 Fatty Acids (FISH OIL) 1000 MG CPDR Take 1,000 mg by mouth daily.  . tadalafil (CIALIS) 5 MG tablet Take 1 tablet (5 mg total) by mouth daily.  Marland Kitchen testosterone cypionate (DEPOTESTOSTERONE CYPIONATE) 200 MG/ML injection Inject 0.5 mLs (100 mg total) into the muscle 2 (two) times a week.       Objective:   Today's Vitals: BP 131/76 (BP Location: Left Arm, Patient Position: Sitting, Cuff Size: Normal)   Pulse 89   Temp 97.6 F (36.4 C) (Temporal)   Resp 18   Ht 5\' 9"  (1.753 m)   Wt 193 lb (87.5 kg)   SpO2 99%   BMI 28.50 kg/m  Vitals with BMI 09/07/2020 02/15/2020 12/16/2019  Height 5\' 9"  5\' 9"  5\' 9"   Weight 193 lbs 192 lbs 10 oz 192 lbs 13 oz  BMI 28.49 22.97 98.92  Systolic 119 417 408  Diastolic 76 75 66  Pulse 89 71 78     Physical Exam  He looks systemically well and very good for his age.  Blood pressure is in good control.  He is more muscular than I have seen him for a long time.     Assessment   1. Colon cancer screening   2. Essential hypertension, benign   3. Benign prostatic hyperplasia with lower urinary tract symptoms, symptom details unspecified   4. Mixed hyperlipidemia   5. Hypothyroidism, adult   6. Testicular failure   7. Vitamin D deficiency disease       Tests ordered Orders Placed This Encounter  Procedures  . Cologuard  . CBC  . COMPLETE METABOLIC PANEL WITH GFR  . DHEA-sulfate  . Lipid panel  . PSA, Total with Reflex to PSA, Free  . T3, free  . T4, free  . TSH  . VITAMIN D 25 Hydroxy (Vit-D Deficiency, Fractures)     Plan: 1. Continue with testosterone therapy as before.  Levels were  in a very good range previously. 2. Continue with NP thyroid and we will check thyroid function. 3. Blood work is ordered above. 4. Further recommendations will depend on results and I will see him in 6 months time for follow-up.   No orders of the defined types were placed in this encounter.   Doree Albee, MD

## 2020-09-08 LAB — COMPLETE METABOLIC PANEL WITH GFR
AG Ratio: 1.8 (calc) (ref 1.0–2.5)
ALT: 17 U/L (ref 9–46)
AST: 23 U/L (ref 10–35)
Albumin: 4.3 g/dL (ref 3.6–5.1)
Alkaline phosphatase (APISO): 53 U/L (ref 35–144)
BUN/Creatinine Ratio: 7 (calc) (ref 6–22)
BUN: 12 mg/dL (ref 7–25)
CO2: 27 mmol/L (ref 20–32)
Calcium: 10.1 mg/dL (ref 8.6–10.3)
Chloride: 102 mmol/L (ref 98–110)
Creat: 1.74 mg/dL — ABNORMAL HIGH (ref 0.70–1.25)
GFR, Est African American: 46 mL/min/{1.73_m2} — ABNORMAL LOW (ref >=60)
GFR, Est Non African American: 40 mL/min/{1.73_m2} — ABNORMAL LOW (ref >=60)
Globulin: 2.4 g/dL (calc) (ref 1.9–3.7)
Glucose, Bld: 99 mg/dL (ref 65–139)
Potassium: 4.9 mmol/L (ref 3.5–5.3)
Sodium: 138 mmol/L (ref 135–146)
Total Bilirubin: 0.6 mg/dL (ref 0.2–1.2)
Total Protein: 6.7 g/dL (ref 6.1–8.1)

## 2020-09-08 LAB — LIPID PANEL
Cholesterol: 203 mg/dL — ABNORMAL HIGH (ref <200)
HDL: 48 mg/dL (ref >=40)
LDL Cholesterol (Calc): 118 mg/dL (calc) — ABNORMAL HIGH
Non-HDL Cholesterol (Calc): 155 mg/dL (calc) — ABNORMAL HIGH (ref <130)
Total CHOL/HDL Ratio: 4.2 (calc) (ref <5.0)
Triglycerides: 260 mg/dL — ABNORMAL HIGH (ref <150)

## 2020-09-08 LAB — DHEA-SULFATE: DHEA-SO4: 85 ug/dL (ref 20–217)

## 2020-09-08 LAB — CBC
HCT: 53.6 % — ABNORMAL HIGH (ref 38.5–50.0)
Hemoglobin: 17.9 g/dL — ABNORMAL HIGH (ref 13.2–17.1)
MCH: 29.4 pg (ref 27.0–33.0)
MCHC: 33.4 g/dL (ref 32.0–36.0)
MCV: 88.2 fL (ref 80.0–100.0)
MPV: 11 fL (ref 7.5–12.5)
Platelets: 215 10*3/uL (ref 140–400)
RBC: 6.08 10*6/uL — ABNORMAL HIGH (ref 4.20–5.80)
RDW: 13 % (ref 11.0–15.0)
WBC: 7.6 10*3/uL (ref 3.8–10.8)

## 2020-09-08 LAB — VITAMIN D 25 HYDROXY (VIT D DEFICIENCY, FRACTURES): Vit D, 25-Hydroxy: 32 ng/mL (ref 30–100)

## 2020-09-08 LAB — TSH: TSH: 0.8 mIU/L (ref 0.40–4.50)

## 2020-09-08 LAB — PSA, TOTAL WITH REFLEX TO PSA, FREE: PSA, Total: 2.9 ng/mL (ref <=4.0)

## 2020-09-08 LAB — T4, FREE: Free T4: 0.9 ng/dL (ref 0.8–1.8)

## 2020-09-08 LAB — T3, FREE: T3, Free: 3.3 pg/mL (ref 2.3–4.2)

## 2020-09-11 ENCOUNTER — Other Ambulatory Visit (INDEPENDENT_AMBULATORY_CARE_PROVIDER_SITE_OTHER): Payer: Self-pay | Admitting: Internal Medicine

## 2020-09-27 ENCOUNTER — Encounter (INDEPENDENT_AMBULATORY_CARE_PROVIDER_SITE_OTHER): Payer: Self-pay | Admitting: Internal Medicine

## 2020-09-27 ENCOUNTER — Other Ambulatory Visit: Payer: Self-pay

## 2020-09-27 ENCOUNTER — Ambulatory Visit (INDEPENDENT_AMBULATORY_CARE_PROVIDER_SITE_OTHER): Payer: Medicare Other | Admitting: Internal Medicine

## 2020-09-27 VITALS — BP 133/70 | HR 63 | Temp 97.6°F | Resp 17 | Ht 69.0 in | Wt 192.0 lb

## 2020-09-27 DIAGNOSIS — L570 Actinic keratosis: Secondary | ICD-10-CM | POA: Diagnosis not present

## 2020-09-27 DIAGNOSIS — N289 Disorder of kidney and ureter, unspecified: Secondary | ICD-10-CM

## 2020-09-27 NOTE — Progress Notes (Signed)
Metrics: Intervention Frequency ACO  Documented Smoking Status Yearly  Screened one or more times in 24 months  Cessation Counseling or  Active cessation medication Past 24 months  Past 24 months   Guideline developer: UpToDate (See UpToDate for funding source) Date Released: 2014       Wellness Office Visit  Subjective:  Patient ID: Edwin Perez, male    DOB: [birth year]  Age: 70 y.o. MRN: [id number]  CC: Renal function abnormality HPI  This man comes in for follow-up renal function tests.  His last blood work at his annual physical showed increasing creatinine.  Since then, he has been drinking more water. Past Medical History:  Diagnosis Date  . BPH (benign prostatic hyperplasia)   . Essential hypertension, benign 02/25/2019  . HLD (hyperlipidemia) 02/25/2019  . Hypertension   . Hypothyroidism, adult 02/25/2019  . Testicular failure 02/25/2019  . Vitamin D deficiency disease 02/25/2019   Past Surgical History:  Procedure Laterality Date  . HAND TENDON SURGERY    . TONSILLECTOMY       History reviewed. No pertinent family history.  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   Social History   Tobacco Use  . Smoking status: Former Smoker    Packs/day: 1.00    Years: 45.00    Pack years: 45.00    Types: Cigarettes    Start date: 09/15/1970    Quit date: 12/09/2015    Years since quitting: 4.8  . Smokeless tobacco: Current User    Types: Snuff  . Tobacco comment: has been dipping snuff for 30 years  Substance Use Topics  . Alcohol use: Not Currently    Current Meds  Medication Sig  . Calcium-Magnesium-Zinc 167-83-8 MG TABS Take 1 tablet by mouth daily.  . Cholecalciferol (VITAMIN D3) 5000 units CAPS Take 1 capsule by mouth daily.  . cyanocobalamin 2000 MCG tablet Take 2,000 mcg by mouth daily.  . NP THYROID 90 MG tablet TAKE 1 TABLET DAILY  . Omega-3 Fatty Acids (FISH OIL) 1000 MG CPDR Take  1,000 mg by mouth daily.  . tadalafil (CIALIS) 5 MG tablet Take 1 tablet (5 mg total) by mouth daily.  Marland Kitchen testosterone cypionate (DEPOTESTOSTERONE CYPIONATE) 200 MG/ML injection Inject 0.5 mLs (100 mg total) into the muscle 2 (two) times a week.       Objective:   Today's Vitals: BP 133/70 (BP Location: Right Arm, Patient Position: Sitting, Cuff Size: Normal)   Pulse 63   Temp 97.6 F (36.4 C) (Temporal)   Resp 17   Ht 5\' 9"  (1.753 m)   Wt 192 lb (87.1 kg) Comment: 199 with all clothes and shoes on  SpO2 97%   BMI 28.35 kg/m  Vitals with BMI 09/27/2020 09/07/2020 02/15/2020  Height 5\' 9"  5\' 9"  5\' 9"   Weight 192 lbs 193 lbs 192 lbs 10 oz  BMI 28.34 82.95 62.13  Systolic 086 578 469  Diastolic 70 76 75  Pulse 63 89 71     Physical Exam  He looks systemically well.  No new physical findings.     Assessment   1. Kidney function abnormal       Tests ordered Orders Placed This Encounter  Procedures  . Basic metabolic panel     Plan: 1. We will repeat a basic metabolic panel and make sure his creatinine is improving.  If it is not, he may need referral to a nephrologist.   No  orders of the defined types were placed in this encounter.   Doree Albee, MD

## 2020-09-28 LAB — BASIC METABOLIC PANEL
BUN/Creatinine Ratio: 10 (calc) (ref 6–22)
BUN: 15 mg/dL (ref 7–25)
CO2: 27 mmol/L (ref 20–32)
Calcium: 9.5 mg/dL (ref 8.6–10.3)
Chloride: 102 mmol/L (ref 98–110)
Creat: 1.56 mg/dL — ABNORMAL HIGH (ref 0.70–1.25)
Glucose, Bld: 81 mg/dL (ref 65–139)
Potassium: 4 mmol/L (ref 3.5–5.3)
Sodium: 137 mmol/L (ref 135–146)

## 2020-10-25 DIAGNOSIS — Z1211 Encounter for screening for malignant neoplasm of colon: Secondary | ICD-10-CM | POA: Diagnosis not present

## 2020-10-25 DIAGNOSIS — Z1212 Encounter for screening for malignant neoplasm of rectum: Secondary | ICD-10-CM | POA: Diagnosis not present

## 2020-10-31 LAB — COLOGUARD: Cologuard: NEGATIVE

## 2021-01-05 DIAGNOSIS — Z23 Encounter for immunization: Secondary | ICD-10-CM | POA: Diagnosis not present

## 2021-02-05 DIAGNOSIS — L570 Actinic keratosis: Secondary | ICD-10-CM | POA: Diagnosis not present

## 2021-02-20 ENCOUNTER — Ambulatory Visit: Payer: TRICARE For Life (TFL) | Admitting: Internal Medicine

## 2021-03-07 DIAGNOSIS — U071 COVID-19: Secondary | ICD-10-CM | POA: Diagnosis not present

## 2021-03-07 DIAGNOSIS — R059 Cough, unspecified: Secondary | ICD-10-CM | POA: Diagnosis not present

## 2021-03-08 DIAGNOSIS — Z20822 Contact with and (suspected) exposure to covid-19: Secondary | ICD-10-CM | POA: Diagnosis not present

## 2021-03-15 ENCOUNTER — Ambulatory Visit (INDEPENDENT_AMBULATORY_CARE_PROVIDER_SITE_OTHER): Payer: TRICARE For Life (TFL) | Admitting: Internal Medicine

## 2021-03-21 DIAGNOSIS — E291 Testicular hypofunction: Secondary | ICD-10-CM | POA: Diagnosis not present

## 2021-03-21 DIAGNOSIS — E039 Hypothyroidism, unspecified: Secondary | ICD-10-CM | POA: Diagnosis not present

## 2021-03-21 DIAGNOSIS — R531 Weakness: Secondary | ICD-10-CM | POA: Diagnosis not present

## 2021-04-09 DIAGNOSIS — E291 Testicular hypofunction: Secondary | ICD-10-CM | POA: Diagnosis not present

## 2021-04-09 DIAGNOSIS — E039 Hypothyroidism, unspecified: Secondary | ICD-10-CM | POA: Diagnosis not present

## 2021-04-09 DIAGNOSIS — Z0000 Encounter for general adult medical examination without abnormal findings: Secondary | ICD-10-CM | POA: Diagnosis not present

## 2021-04-16 DIAGNOSIS — Z23 Encounter for immunization: Secondary | ICD-10-CM | POA: Diagnosis not present

## 2021-04-27 ENCOUNTER — Ambulatory Visit
Admission: EM | Admit: 2021-04-27 | Discharge: 2021-04-27 | Disposition: A | Discharge status: Home / Self Care | Payer: Medicare Other | Attending: Family Medicine | Admitting: Family Medicine

## 2021-04-27 ENCOUNTER — Other Ambulatory Visit: Payer: Self-pay

## 2021-04-27 DIAGNOSIS — B9789 Other viral agents as the cause of diseases classified elsewhere: Secondary | ICD-10-CM

## 2021-04-27 DIAGNOSIS — J329 Chronic sinusitis, unspecified: Secondary | ICD-10-CM | POA: Diagnosis not present

## 2021-04-27 DIAGNOSIS — R062 Wheezing: Secondary | ICD-10-CM

## 2021-04-27 DIAGNOSIS — J3089 Other allergic rhinitis: Secondary | ICD-10-CM | POA: Diagnosis not present

## 2021-04-27 MED ORDER — CETIRIZINE HCL 10 MG PO TABS
10.0000 mg | ORAL_TABLET | Freq: Every day | ORAL | 0 refills | Status: DC
Start: 2021-04-27 — End: 2022-03-05

## 2021-04-27 MED ORDER — FLUTICASONE PROPIONATE 50 MCG/ACT NA SUSP
1.0000 | Freq: Two times a day (BID) | NASAL | 2 refills | Status: DC
Start: 2021-04-27 — End: 2022-09-01

## 2021-04-27 MED ORDER — PREDNISONE 20 MG PO TABS
40.0000 mg | ORAL_TABLET | Freq: Every day | ORAL | 0 refills | Status: DC
Start: 2021-04-27 — End: 2021-08-01

## 2021-04-27 NOTE — ED Provider Notes (Signed)
[facility]    CSN: [id number] Arrival date & time: 04/27/21  5974      History   Chief Complaint Chief Complaint  Patient presents with   Cough   Nasal Congestion    HPI Edwin Perez is a 70 y.o. male.   Presenting today with 1 day history of cough, postnasal drip, sinus pressure, headache, generalized malaise.  States he had COVID 1 month ago and was just not feeling like he was getting his energy back and fully resolving from that.  Denies fever, body aches, chills, chest pain, shortness of breath, abdominal pain, nausea vomiting or diarrhea.  Multiple sick contacts, works at [employer].  So far took a sinus pill over-the-counter last night with no relief.  History of seasonal allergies taking as needed antihistamines.   Past Medical History:  Diagnosis Date   BPH (benign prostatic hyperplasia)    Essential hypertension, benign 02/25/2019   HLD (hyperlipidemia) 02/25/2019   Hypertension    Hypothyroidism, adult 02/25/2019   Testicular failure 02/25/2019   Vitamin D deficiency disease 02/25/2019    Patient Active Problem List   Diagnosis Date Noted   Essential hypertension, benign 02/25/2019   Testicular failure 02/25/2019   Hypothyroidism, adult 02/25/2019   Vitamin D deficiency disease 02/25/2019   HLD (hyperlipidemia) 02/25/2019    Past Surgical History:  Procedure Laterality Date   HAND TENDON SURGERY     TONSILLECTOMY         Home Medications    Prior to Admission medications   Medication Sig Start Date End Date Taking? Authorizing Provider  cetirizine (ZYRTEC ALLERGY) 10 MG tablet Take 1 tablet (10 mg total) by mouth daily. 04/27/21  Yes Volney American, PA-C  fluticasone [facility]) 50 MCG/ACT nasal spray Place 1 spray into both nostrils 2 (two) times daily. 04/27/21  Yes Volney American, PA-C  predniSONE (DELTASONE) 20 MG tablet Take 2 tablets (40 mg total) by mouth daily with breakfast. 04/27/21  Yes Volney American, PA-C   Calcium-Magnesium-Zinc [phone] MG TABS Take 1 tablet by mouth daily. Patient not taking: Reported on 04/27/2021    [provider]  Cholecalciferol (VITAMIN D3) 5000 units CAPS Take 1 capsule by mouth daily.    [provider]  cyanocobalamin 2000 MCG tablet Take 2,000 mcg by mouth daily.    [provider]  NP THYROID 90 MG tablet TAKE 1 TABLET DAILY 09/11/20 12/10/20  Doree Albee, MD  Omega-3 Fatty Acids (FISH OIL) 1000 MG CPDR Take 1,000 mg by mouth daily.    [provider]  tadalafil (CIALIS) 5 MG tablet Take 1 tablet (5 mg total) by mouth daily. 06/15/20   Doree Albee, MD  testosterone cypionate (DEPOTESTOSTERONE CYPIONATE) 200 MG/ML injection Inject 0.5 mLs (100 mg total) into the muscle 2 (two) times a week. 08/21/20   Doree Albee, MD    Family History History reviewed. No pertinent family history.  Social History Social History   Tobacco Use   Smoking status: Former    Packs/day: 1.00    Years: 45.00    Pack years: 45.00    Types: Cigarettes    Start date: 09/15/1970    Quit date: 12/09/2015    Years since quitting: 5.3   Smokeless tobacco: Current    Types: Snuff   Tobacco comments:    has been dipping snuff for 30 years  Substance Use Topics   Alcohol use: Not Currently   Drug use: No  Allergies   Patient has no known allergies.   Review of Systems Review of Systems Per HPI  Physical Exam Triage Vital Signs ED Triage Vitals [04/27/21 1024]  Enc Vitals Group     BP (!) 142/80     Pulse Rate 76     Resp 14     Temp 98.5 F (36.9 C)     Temp Source Oral     SpO2 95 %     Weight      Height      Head Circumference      Peak Flow      Pain Score 0     Pain Loc      Pain Edu?      Excl. in [place]?    No data found.  Updated Vital Signs BP (!) 142/80 (BP Location: Right Arm)   Pulse 76   Temp 98.5 F (36.9 C) (Oral)   Resp 14   SpO2 95%   Visual Acuity Right Eye Distance:   Left Eye Distance:    Bilateral Distance:    Right Eye Near:   Left Eye Near:    Bilateral Near:     Physical Exam Vitals and nursing note reviewed.  Constitutional:      Appearance: Normal appearance.  HENT:     Head: Atraumatic.     Right Ear: Tympanic membrane normal.     Left Ear: Tympanic membrane normal.     Nose: Rhinorrhea present.     Mouth/Throat:     Mouth: Mucous membranes are moist.     Pharynx: Posterior oropharyngeal erythema present.  Eyes:     Extraocular Movements: Extraocular movements intact.     Conjunctiva/sclera: Conjunctivae normal.  Cardiovascular:     Rate and Rhythm: Normal rate and regular rhythm.  Pulmonary:     Effort: Pulmonary effort is normal.     Breath sounds: Wheezing present. No rales.     Comments: Minimal scattered wheezes at bases Musculoskeletal:        General: Normal range of motion.     Cervical back: Normal range of motion and neck supple.  Skin:    General: Skin is warm and dry.  Neurological:     General: No focal deficit present.     Mental Status: He is oriented to person, place, and time.  Psychiatric:        Mood and Affect: Mood normal.        Thought Content: Thought content normal.        Judgment: Judgment normal.   UC Treatments / Results  Labs (all labs ordered are listed, but only abnormal results are displayed) Labs Reviewed - No data to display  EKG   Radiology No results found.  Procedures Procedures (including critical care time)  Medications Ordered in UC Medications - No data to display  Initial Impression / Assessment and Plan / UC Course  I have reviewed the triage vital signs and the nursing notes.  Pertinent labs & imaging results that were available during my care of the patient were reviewed by me and considered in my medical decision making (see chart for details).     Consistent with viral upper respiratory tract infection, possibly also with some underlying uncontrolled seasonal allergies.  Start  consistent allergy regimen with Zyrtec and twice daily Flonase, prednisone burst for current symptoms.  We will forego viral testing today per patient request.  Discussed supportive over-the-counter medications and home care, return precautions.  Final  Clinical Impressions(s) / UC Diagnoses   Final diagnoses:  Viral sinusitis  Seasonal allergic rhinitis due to other allergic trigger  Wheezing   Discharge Instructions   None    ED Prescriptions     Medication Sig Dispense Auth. Provider   cetirizine (ZYRTEC ALLERGY) 10 MG tablet Take 1 tablet (10 mg total) by mouth daily. 90 tablet [place], PA-C   fluticasone [facility]) 50 MCG/ACT nasal spray Place 1 spray into both nostrils 2 (two) times daily. 16 g Volney American, [place]   predniSONE (DELTASONE) 20 MG tablet Take 2 tablets (40 mg total) by mouth daily with breakfast. 10 tablet Volney American, [place]      PDMP not reviewed this encounter.   Volney American, [place] 04/27/21 1254

## 2021-04-27 NOTE — ED Triage Notes (Signed)
Pt presents with cough and nasal drainage that began yesterday. Pt states he had covid 1 month ago. Pt denies body aches or fever

## 2021-05-05 ENCOUNTER — Ambulatory Visit (INDEPENDENT_AMBULATORY_CARE_PROVIDER_SITE_OTHER): Payer: Medicare Other

## 2021-05-05 ENCOUNTER — Ambulatory Visit
Admission: EM | Admit: 2021-05-05 | Discharge: 2021-05-05 | Disposition: A | Discharge status: Home / Self Care | Payer: Medicare Other | Attending: Physician Assistant | Admitting: Physician Assistant

## 2021-05-05 DIAGNOSIS — J209 Acute bronchitis, unspecified: Secondary | ICD-10-CM | POA: Diagnosis not present

## 2021-05-05 DIAGNOSIS — R509 Fever, unspecified: Secondary | ICD-10-CM | POA: Diagnosis not present

## 2021-05-05 DIAGNOSIS — R059 Cough, unspecified: Secondary | ICD-10-CM

## 2021-05-05 MED ORDER — ALBUTEROL SULFATE HFA 108 (90 BASE) MCG/ACT IN AERS
2.0000 | INHALATION_SPRAY | Freq: Four times a day (QID) | RESPIRATORY_TRACT | 2 refills | Status: DC | PRN
Start: 2021-05-05 — End: 2023-09-04

## 2021-05-05 MED ORDER — DOXYCYCLINE HYCLATE 100 MG PO CAPS
100.0000 mg | ORAL_CAPSULE | Freq: Two times a day (BID) | ORAL | 0 refills | Status: DC
Start: 2021-05-05 — End: 2021-08-01

## 2021-05-05 NOTE — ED Triage Notes (Signed)
Pt states that he has a cough, chest congestion and low grade fever. X1 week  Pt states that he was seen 1 week ago and isn't feeling any better.  Pt states that he is vaccinated.  Pt states that he has had flu vaccine.

## 2021-05-05 NOTE — Discharge Instructions (Addendum)
See your Physician for recheck if symptoms persist

## 2021-05-09 NOTE — ED Provider Notes (Signed)
[facility]    CSN: [id number] Arrival date & time: 05/05/21  0904      History   Chief Complaint Chief Complaint  Patient presents with   Cough    Cough, chest congestion, low grade fever. X1 week    HPI Edwin Perez is a 70 y.o. male.   The history is provided by the patient. No language interpreter was used.  Cough Cough characteristics:  Non-productive Sputum characteristics:  Nondescript Severity:  Moderate Onset quality:  Gradual Timing:  Constant Progression:  Worsening Chronicity:  New Context: sick contacts   Relieved by:  Nothing Ineffective treatments:  None tried Associated symptoms: shortness of breath    Past Medical History:  Diagnosis Date   BPH (benign prostatic hyperplasia)    Essential hypertension, benign 02/25/2019   HLD (hyperlipidemia) 02/25/2019   Hypertension    Hypothyroidism, adult 02/25/2019   Testicular failure 02/25/2019   Vitamin D deficiency disease 02/25/2019    Patient Active Problem List   Diagnosis Date Noted   Essential hypertension, benign 02/25/2019   Testicular failure 02/25/2019   Hypothyroidism, adult 02/25/2019   Vitamin D deficiency disease 02/25/2019   HLD (hyperlipidemia) 02/25/2019    Past Surgical History:  Procedure Laterality Date   HAND TENDON SURGERY     TONSILLECTOMY         Home Medications    Prior to Admission medications   Medication Sig Start Date End Date Taking? Authorizing Provider  albuterol (VENTOLIN HFA) 108 (90 Base) MCG/ACT inhaler Inhale 2 puffs into the lungs every 6 (six) hours as needed for wheezing or shortness of breath. 05/05/21  Yes Fransico Meadow, PA-C  Calcium-Magnesium-Zinc [phone] MG TABS Take 1 tablet by mouth daily.   Yes [provider]  cetirizine (ZYRTEC ALLERGY) 10 MG tablet Take 1 tablet (10 mg total) by mouth daily. 04/27/21  Yes Volney American, PA-C  Cholecalciferol (VITAMIN D3) 5000 units CAPS Take 1 capsule by mouth daily.   Yes  [provider]  cyanocobalamin 2000 MCG tablet Take 2,000 mcg by mouth daily.   Yes [provider]  doxycycline (VIBRAMYCIN) 100 MG capsule Take 1 capsule (100 mg total) by mouth 2 (two) times daily. 05/05/21  Yes Caryl Ada K, PA-C  fluticasone (FLONASE) 50 MCG/ACT nasal spray Place 1 spray into both nostrils 2 (two) times daily. 04/27/21  Yes Volney American, PA-C  Omega-3 Fatty Acids (FISH OIL) 1000 MG CPDR Take 1,000 mg by mouth daily.   Yes [provider]  predniSONE (DELTASONE) 20 MG tablet Take 2 tablets (40 mg total) by mouth daily with breakfast. 04/27/21  Yes Volney American, PA-C  tadalafil (CIALIS) 5 MG tablet Take 1 tablet (5 mg total) by mouth daily. 06/15/20  Yes Gosrani, Nimish C, MD  testosterone cypionate (DEPOTESTOSTERONE CYPIONATE) 200 MG/ML injection Inject 0.5 mLs (100 mg total) into the muscle 2 (two) times a week. 08/21/20  Yes Doree Albee, MD  NP THYROID 90 MG tablet TAKE 1 TABLET DAILY 09/11/20 12/10/20  Doree Albee, MD    Family History History reviewed. No pertinent family history.  Social History Social History   Tobacco Use   Smoking status: Former    Packs/day: 1.00    Years: 45.00    Pack years: 45.00    Types: Cigarettes    Start date: 09/15/1970    Quit date: 12/09/2015    Years since quitting: 5.4   Smokeless tobacco: Current    Types: Snuff  Tobacco comments:    has been dipping snuff for 30 years  Substance Use Topics   Alcohol use: Not Currently   Drug use: No     Allergies   Patient has no known allergies.   Review of Systems Review of Systems  Respiratory:  Positive for cough and shortness of breath.   All other systems reviewed and are negative.   Physical Exam Triage Vital Signs ED Triage Vitals  Enc Vitals Group     BP 05/05/21 1116 127/77     Pulse Rate 05/05/21 1116 72     Resp 05/05/21 1116 18     Temp 05/05/21 1116 98.1 F (36.7 C)     Temp Source 05/05/21 1116 Oral      SpO2 --      Weight 05/05/21 1115 195 lb (88.5 kg)     Height 05/05/21 1115 5\' 9"  (1.753 m)     Head Circumference --      Peak Flow --      Pain Score 05/05/21 1115 0     Pain Loc --      Pain Edu? --      Excl. in [place]? --    No data found.  Updated Vital Signs BP 127/77 (BP Location: Right Arm)   Pulse 72   Temp 98.1 F (36.7 C) (Oral)   Resp 18   Ht 5\' 9"  (1.753 m)   Wt 88.5 kg   BMI 28.80 kg/m   Visual Acuity Right Eye Distance:   Left Eye Distance:   Bilateral Distance:    Right Eye Near:   Left Eye Near:    Bilateral Near:     Physical Exam Vitals and nursing note reviewed.  Constitutional:      General: He is not in acute distress.    Appearance: He is well-developed.  HENT:     Head: Normocephalic and atraumatic.  Eyes:     Conjunctiva/sclera: Conjunctivae normal.  Cardiovascular:     Rate and Rhythm: Normal rate and regular rhythm.     Heart sounds: No murmur heard. Pulmonary:     Effort: Pulmonary effort is normal. No respiratory distress.     Breath sounds: Normal breath sounds.  Abdominal:     Palpations: Abdomen is soft.     Tenderness: There is no abdominal tenderness.  Musculoskeletal:        General: No swelling.     Cervical back: Neck supple.  Skin:    General: Skin is warm and dry.     Capillary Refill: Capillary refill takes less than 2 seconds.  Neurological:     Mental Status: He is alert.  Psychiatric:        Mood and Affect: Mood normal.     UC Treatments / Results  Labs (all labs ordered are listed, but only abnormal results are displayed) Labs Reviewed - No data to display  EKG   Radiology No results found.  Procedures Procedures (including critical care time)  Medications Ordered in UC Medications - No data to display  Initial Impression / Assessment and Plan / UC Course  I have reviewed the triage vital signs and the nursing notes.  Pertinent labs & imaging results that were available during my care of  the patient were reviewed by me and considered in my medical decision making (see chart for details).      Final Clinical Impressions(s) / UC Diagnoses   Final diagnoses:  Acute bronchitis, unspecified organism  Discharge Instructions      See your Physician for recheck if symptoms persist    ED Prescriptions     Medication Sig Dispense Auth. Provider   albuterol (VENTOLIN HFA) 108 (90 Base) MCG/ACT inhaler Inhale 2 puffs into the lungs every 6 (six) hours as needed for wheezing or shortness of breath. 8 g Charnette Younkin K, [place]   doxycycline (VIBRAMYCIN) 100 MG capsule Take 1 capsule (100 mg total) by mouth 2 (two) times daily. 20 capsule Fransico Meadow, [place]      PDMP not reviewed this encounter.   Fransico Meadow, [place] 05/09/21 1052

## 2021-05-15 DIAGNOSIS — Z79899 Other long term (current) drug therapy: Secondary | ICD-10-CM | POA: Diagnosis not present

## 2021-05-15 DIAGNOSIS — Z13220 Encounter for screening for lipoid disorders: Secondary | ICD-10-CM | POA: Diagnosis not present

## 2021-05-15 DIAGNOSIS — E039 Hypothyroidism, unspecified: Secondary | ICD-10-CM | POA: Diagnosis not present

## 2021-05-15 DIAGNOSIS — Z131 Encounter for screening for diabetes mellitus: Secondary | ICD-10-CM | POA: Diagnosis not present

## 2021-05-15 DIAGNOSIS — R531 Weakness: Secondary | ICD-10-CM | POA: Diagnosis not present

## 2021-05-15 DIAGNOSIS — E291 Testicular hypofunction: Secondary | ICD-10-CM | POA: Diagnosis not present

## 2021-05-21 DIAGNOSIS — E291 Testicular hypofunction: Secondary | ICD-10-CM | POA: Diagnosis not present

## 2021-05-21 DIAGNOSIS — E039 Hypothyroidism, unspecified: Secondary | ICD-10-CM | POA: Diagnosis not present

## 2021-05-21 DIAGNOSIS — R718 Other abnormality of red blood cells: Secondary | ICD-10-CM | POA: Diagnosis not present

## 2021-05-21 DIAGNOSIS — E559 Vitamin D deficiency, unspecified: Secondary | ICD-10-CM | POA: Diagnosis not present

## 2021-05-21 DIAGNOSIS — J309 Allergic rhinitis, unspecified: Secondary | ICD-10-CM | POA: Diagnosis not present

## 2021-07-12 DIAGNOSIS — M25562 Pain in left knee: Secondary | ICD-10-CM | POA: Diagnosis not present

## 2021-07-12 DIAGNOSIS — G589 Mononeuropathy, unspecified: Secondary | ICD-10-CM | POA: Diagnosis not present

## 2021-08-01 ENCOUNTER — Ambulatory Visit (INDEPENDENT_AMBULATORY_CARE_PROVIDER_SITE_OTHER): Payer: Medicare Other | Admitting: "Endocrinology

## 2021-08-01 ENCOUNTER — Encounter: Payer: Self-pay | Admitting: "Endocrinology

## 2021-08-01 VITALS — HR 80 | Ht 69.0 in | Wt 202.6 lb

## 2021-08-01 DIAGNOSIS — E291 Testicular hypofunction: Secondary | ICD-10-CM | POA: Diagnosis not present

## 2021-08-01 DIAGNOSIS — E559 Vitamin D deficiency, unspecified: Secondary | ICD-10-CM

## 2021-08-01 DIAGNOSIS — E039 Hypothyroidism, unspecified: Secondary | ICD-10-CM | POA: Diagnosis not present

## 2021-08-01 DIAGNOSIS — E782 Mixed hyperlipidemia: Secondary | ICD-10-CM

## 2021-08-01 MED ORDER — LEVOTHYROXINE SODIUM 88 MCG PO TABS
88.0000 ug | ORAL_TABLET | Freq: Every day | ORAL | 1 refills | Status: DC
Start: 2021-08-01 — End: 2021-11-07

## 2021-08-01 NOTE — Progress Notes (Signed)
Endocrinology Consult Note                                            08/01/2021, 3:13 PM   Subjective:    Patient ID: Edwin Perez, male    DOB: 1950/08/17, PCP P[facility], [facility]   Past Medical History:  Diagnosis Date   BPH (benign prostatic hyperplasia)    Essential hypertension, benign 02/25/2019   HLD (hyperlipidemia) 02/25/2019   Hypertension    Hypothyroidism, adult 02/25/2019   Testicular failure 02/25/2019   Vitamin D deficiency disease 02/25/2019   Past Surgical History:  Procedure Laterality Date   HAND TENDON SURGERY     TONSILLECTOMY     Social History   Socioeconomic History   Marital status: Married    Spouse name: Not on file   Number of children: Not on file   Years of education: Not on file   Highest education level: Not on file  Occupational History   Not on file  Tobacco Use   Smoking status: Former    Packs/day: 1.00    Years: 45.00    Pack years: 45.00    Types: Cigarettes    Start date: 09/15/1970    Quit date: 12/09/2015    Years since quitting: 5.6   Smokeless tobacco: Current    Types: Snuff   Tobacco comments:    has been dipping snuff for 30 years  Substance and Sexual Activity   Alcohol use: Not Currently   Drug use: No   Sexual activity: Not on file  Other Topics Concern   Not on file  Social History Narrative   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   Social Determinants of Health   Financial Resource Strain: Not on file  Food Insecurity: Not on file  Transportation Needs: Not on file  Physical Activity: Not on file  Stress: Not on file  Social Connections: Not on file   History reviewed. No pertinent family history. Outpatient Encounter Medications as of 08/01/2021  Medication Sig   levothyroxine (SYNTHROID) 88 MCG tablet Take 1 tablet (88 mcg total) by mouth daily before breakfast.   albuterol (VENTOLIN HFA) 108 (90 Base) MCG/ACT inhaler Inhale 2 puffs into the lungs every 6  (six) hours as needed for wheezing or shortness of breath.   cetirizine (ZYRTEC ALLERGY) 10 MG tablet Take 1 tablet (10 mg total) by mouth daily.   fluticasone (FLONASE) 50 MCG/ACT nasal spray Place 1 spray into both nostrils 2 (two) times daily.   Omega-3 Fatty Acids (FISH OIL) 1000 MG CPDR Take 1,000 mg by mouth daily.   tadalafil (CIALIS) 5 MG tablet Take 1 tablet (5 mg total) by mouth daily.   Vitamin D, Ergocalciferol, (DRISDOL) 1.25 MG (50000 UNIT) CAPS capsule Take 50,000 Units by mouth once a week.   [DISCONTINUED] ALLERGY RELIEF 10 MG tablet Take 10 mg by mouth daily.   [DISCONTINUED] Calcium-Magnesium-Zinc 167-83-8 MG TABS Take 1 tablet by mouth daily.   [DISCONTINUED] Cholecalciferol (VITAMIN D3) 5000 units CAPS Take 1 capsule by mouth daily.   [DISCONTINUED] cyanocobalamin 2000 MCG tablet Take 2,000 mcg by mouth daily.   [DISCONTINUED] doxycycline (VIBRAMYCIN) 100 MG capsule Take 1 capsule (100 mg total) by mouth 2 (two) times daily.   [DISCONTINUED] NP THYROID 90 MG tablet TAKE 1 TABLET DAILY   [DISCONTINUED] predniSONE (  DELTASONE) 20 MG tablet Take 2 tablets (40 mg total) by mouth daily with breakfast.   [DISCONTINUED] testosterone cypionate (DEPOTESTOSTERONE CYPIONATE) 200 MG/ML injection Inject 0.5 mLs (100 mg total) into the muscle 2 (two) times a week.   No facility-administered encounter medications on file as of 08/01/2021.   ALLERGIES: No Known Allergies  VACCINATION STATUS: Immunization History  Administered Date(s) Administered   Fluad Quad(high Dose 65+) 03/11/2019, 03/11/2019, 03/16/2020   PFIZER(Purple Top)SARS-COV-2 Vaccination 07/23/2019, 08/15/2019    HPI Edwin Perez is 71 y.o. male who presents today with a medical history as above. he is being seen in consultation for hypothyroidism and hypogonadism requested by Pll[facility], [facility].   History is obtained directly from the patient.  He reports that he was diagnosed with hypothyroidism at approximate  age of 8.  He was treated with various dose of thyroid hormone.  He is currently on NP thyroid 90 mg p.o. daily.  He denies prior history of thyroid surgery nor thyroid ablation.  He denies family history of thyroid dysfunction.  His recent labs show TSH within normal range.  He reports diffuse body aches, heat intolerance.  He denies dysphagia, shortness of breath, nor voice change.  He never had thyroid imaging. He also has hypogonadism for which he is on testosterone injection 200 mg twice a week.  Review of his medical records show supraphysiologic testosterone levels more treatment recently 1108 on a random blood test on May 15, 2021 associated with polycythemia with hematocrit of 57, hemoglobin 19.1. 1 other prior measurement showed 1464 total testosterone. He has prior use of bodybuilding androgens/protein preparations.  He denies any prolonged history of use of opioids.  He denies testicular injury, chemotherapy, radiation, nor head injury.  He has 2 grown kids. He notes that he did not know he had supraphysiologic levels of testosterone.  He has medical history of benign prostatic hyperplasia. He uses Cialis for ED.  He is on vitamin D for vitamin D deficiency.   Review of Systems  Constitutional: + Minimally fluctuating body weight,  no fatigue, no subjective hyperthermia, no subjective hypothermia Eyes: no blurry vision, no xerophthalmia ENT: no sore throat, no nodules palpated in throat, no dysphagia/odynophagia, no hoarseness Cardiovascular: no Chest Pain, no Shortness of Breath, no palpitations, no leg swelling Respiratory: no cough, no shortness of breath Gastrointestinal: no Nausea/Vomiting/Diarhhea Musculoskeletal: no muscle/joint aches Skin: no rashes Neurological: no tremors, no numbness, no tingling, no dizziness Psychiatric: no depression, no anxiety  Objective:    Vitals with BMI 08/01/2021 05/05/2021 04/27/2021  Height 5\' 9"  5\' 9"  -  Weight 202 lbs 10 oz 195  lbs -  BMI 71.06 26.94 -  Systolic - 854 627  Diastolic - 77 80  Pulse 80 72 76    Pulse 80    Ht 5\' 9"  (1.753 m)    Wt 202 lb 9.6 oz (91.9 kg)    BMI 29.92 kg/m   Wt Readings from Last 3 Encounters:  08/01/21 202 lb 9.6 oz (91.9 kg)  05/05/21 195 lb (88.5 kg)  09/27/20 192 lb (87.1 kg)    Physical Exam  Constitutional:  Body mass index is 29.92 kg/m.,  not in acute distress, normal state of mind Eyes: PERRLA, EOMI, no exophthalmos ENT: moist mucous membranes, no gross thyromegaly, no gross cervical lymphadenopathy Cardiovascular: normal precordial activity, Regular Rate and Rhythm, no Murmur/Rubs/Gallops Respiratory:  adequate breathing efforts, no gross chest deformity, Clear to auscultation bilaterally Gastrointestinal: abdomen soft, Non -tender, No distension, Bowel Sounds  present, no gross organomegaly Musculoskeletal: no gross deformities, strength intact in all four extremities Genital exam: Testicles are shrunk bilaterally to 15 cc.  No scrotal mass, no hernia. Skin: moist, warm, no rashes Neurological: no tremor with outstretched hands, Deep tendon reflexes normal in bilateral lower extremities.  CMP ( most recent) CMP     Component Value Date/Time   NA 137 09/27/2020 1721   K 4.0 09/27/2020 1721   CL 102 09/27/2020 1721   CO2 27 09/27/2020 1721   GLUCOSE 81 09/27/2020 1721   BUN 15 09/27/2020 1721   CREATININE 1.56 (H) 09/27/2020 1721   CALCIUM 9.5 09/27/2020 1721   PROT 6.7 09/07/2020 1144   AST 23 09/07/2020 1144   ALT 17 09/07/2020 1144   BILITOT 0.6 09/07/2020 1144   GFRNONAA 40 (L) 09/07/2020 1144   GFRAA 46 (L) 09/07/2020 1144       Lipid Panel ( most recent) Lipid Panel     Component Value Date/Time   CHOL 203 (H) 09/07/2020 1144   TRIG 260 (H) 09/07/2020 1144   HDL 48 09/07/2020 1144   CHOLHDL 4.2 09/07/2020 1144   LDLCALC 118 (H) 09/07/2020 1144      Lab Results  Component Value Date   TSH 0.80 09/07/2020   TSH 0.70 02/25/2019    FREET4 0.9 09/07/2020    Total testosterone: 1108, 1464   CBC    Component Value Date/Time   WBC 7.6 09/07/2020 1144   RBC 6.08 (H) 09/07/2020 1144   HGB 17.9 (H) 09/07/2020 1144   HCT 53.6 (H) 09/07/2020 1144   PLT 215 09/07/2020 1144   MCV 88.2 09/07/2020 1144   MCH 29.4 09/07/2020 1144   MCHC 33.4 09/07/2020 1144   RDW 13.0 09/07/2020 1144   PSA was 2.9 on September 07, 2020.  Assessment & Plan:   1. Hypothyroidism, unspecified type 2.  Hypogonadism 3.  Hyperlipidemia 4.  Vitamin D deficiency   - Edwin Perez  is being seen at a kind request of [facility], [facility]. - I have reviewed his available endocrine records and clinically evaluated the patient. - Based on these reviews, he has multiple endocrine problems including hypothyroidism, hypogonadism, hyperlipidemia, vitamin D deficiency. -Regarding his hypothyroidism: It appears clinically that he is being over replaced.  The thyroid panels are not the best test to assess adequacy of NP thyroid.  He is willing to switch to levothyroxine.  I discussed and prescribed levothyroxine 88 mcg p.o. daily before breakfast and advised him to stop NP thyroid for now.    - We discussed about the correct intake of his thyroid hormone, on empty stomach at fasting, with water, separated by at least 30 minutes from breakfast and other medications,  and separated by more than 4 hours from calcium, iron, multivitamins, acid reflux medications (PPIs). -Patient is made aware of the fact that thyroid hormone replacement is needed for life, dose to be adjusted by periodic monitoring of thyroid function tests.  Regarding hypogonadism: The circumstances of his diagnosis with hypogonadism are not available to review.  In any case, he is at risk of iatrogenic hyperandrogenism.  I discussed age correlated testosterone target with him.  He is clearly at risk of adverse effects of testosterone at this levels.  He is advised to discontinue his testosterone  treatment until next measurement in 3 months. Has a 71 year old man he is average testosterone will be between 200-350. For safety reasons, I approach him to be ready for lower numbers than his  current testosterone levels at 1108. His testicular exam reveals chronic hypogonadism likely related to his use of anabolic steroids or androgens for bodybuilding.  He will likely end up requiring some testosterone supplement.  He is not looking for fertility.  He has history of BPH is a relative contraindication, hence, he would benefit from a lower end of the normal range. -His prior CBC measurements also showed polycythemia likely correlated to supraphysiologic testosterone levels.  He will need repeat CBC before his next visit.  He is advised to continue vitamin D 50,000 units weekly. -Regarding his dyslipidemia, he is only on omega-3 fatty acids.  He will likely need statin intervention, will be discussed next visit. -I had a long discussion with him Edwin Perez regarding safe use of medications and adverse effects of unnecessarily high dose  prescriptions including testosterone. - he is advised to maintain close follow up with Pllc, [facility] for primary care needs.   - Time spent with the patient: 65 minutes, of which >50% was spent in  counseling him about his hypogonadism, hypothyroidism, hyperlipidemia, vitamin deficiency and the rest in obtaining information about his symptoms, reviewing his previous labs/studies ( including abstractions from other facilities),  evaluations, and treatments,  and developing a plan to confirm diagnosis and long term treatment based on the latest standards of care/guidelines; and documenting his care.  Edwin Perez participated in the discussions, expressed understanding, and voiced agreement with the above plans.  All questions were answered to his satisfaction. he is encouraged to contact clinic should he have any questions or concerns prior to his return  visit.  Follow up plan: Return in about 3 months (around 10/29/2021) for Fasting Labs  in AM B4 8, Thyroid / Neck Ultrasound.   Glade Lloyd, MD [facility] [address] [place], [place] [address] Phone: [phone]  Fax: [phone]     08/01/2021, 3:13 PM  This note was partially dictated with voice recognition software. Similar sounding words can be transcribed inadequately or may not  be corrected upon review.

## 2021-08-06 DIAGNOSIS — L570 Actinic keratosis: Secondary | ICD-10-CM | POA: Diagnosis not present

## 2021-08-10 ENCOUNTER — Other Ambulatory Visit: Payer: Self-pay

## 2021-08-10 ENCOUNTER — Ambulatory Visit (HOSPITAL_COMMUNITY)
Admission: RE | Admit: 2021-08-10 | Discharge: 2021-08-10 | Disposition: A | Discharge status: Home / Self Care | Payer: Medicare Other | Source: Ambulatory Visit | Attending: "Endocrinology | Admitting: "Endocrinology

## 2021-08-10 DIAGNOSIS — E291 Testicular hypofunction: Secondary | ICD-10-CM | POA: Insufficient documentation

## 2021-08-10 DIAGNOSIS — E049 Nontoxic goiter, unspecified: Secondary | ICD-10-CM | POA: Diagnosis not present

## 2021-08-10 DIAGNOSIS — E039 Hypothyroidism, unspecified: Secondary | ICD-10-CM | POA: Diagnosis not present

## 2021-08-13 DIAGNOSIS — E559 Vitamin D deficiency, unspecified: Secondary | ICD-10-CM | POA: Diagnosis not present

## 2021-08-13 DIAGNOSIS — E039 Hypothyroidism, unspecified: Secondary | ICD-10-CM | POA: Diagnosis not present

## 2021-08-13 DIAGNOSIS — Z79899 Other long term (current) drug therapy: Secondary | ICD-10-CM | POA: Diagnosis not present

## 2021-08-13 DIAGNOSIS — E291 Testicular hypofunction: Secondary | ICD-10-CM | POA: Diagnosis not present

## 2021-08-20 DIAGNOSIS — R718 Other abnormality of red blood cells: Secondary | ICD-10-CM | POA: Diagnosis not present

## 2021-08-20 DIAGNOSIS — E039 Hypothyroidism, unspecified: Secondary | ICD-10-CM | POA: Diagnosis not present

## 2021-08-20 DIAGNOSIS — E291 Testicular hypofunction: Secondary | ICD-10-CM | POA: Diagnosis not present

## 2021-08-20 DIAGNOSIS — E559 Vitamin D deficiency, unspecified: Secondary | ICD-10-CM | POA: Diagnosis not present

## 2021-09-17 DIAGNOSIS — E291 Testicular hypofunction: Secondary | ICD-10-CM | POA: Diagnosis not present

## 2021-09-17 DIAGNOSIS — R718 Other abnormality of red blood cells: Secondary | ICD-10-CM | POA: Diagnosis not present

## 2021-10-24 DIAGNOSIS — E291 Testicular hypofunction: Secondary | ICD-10-CM | POA: Diagnosis not present

## 2021-10-24 DIAGNOSIS — E039 Hypothyroidism, unspecified: Secondary | ICD-10-CM | POA: Diagnosis not present

## 2021-10-25 LAB — CBC WITH DIFFERENTIAL/PLATELET
Basophils Absolute: 0.1 10*3/uL (ref 0.0–0.2)
Basos: 1 %
EOS (ABSOLUTE): 0.3 10*3/uL (ref 0.0–0.4)
Eos: 5 %
Hematocrit: 44.1 % (ref 37.5–51.0)
Hemoglobin: 15.3 g/dL (ref 13.0–17.7)
Immature Grans (Abs): 0 10*3/uL (ref 0.0–0.1)
Immature Granulocytes: 0 %
Lymphocytes Absolute: 1.8 10*3/uL (ref 0.7–3.1)
Lymphs: 34 %
MCH: 30.4 pg (ref 26.6–33.0)
MCHC: 34.7 g/dL (ref 31.5–35.7)
MCV: 88 fL (ref 79–97)
Monocytes Absolute: 0.5 10*3/uL (ref 0.1–0.9)
Monocytes: 9 %
Neutrophils Absolute: 2.8 10*3/uL (ref 1.4–7.0)
Neutrophils: 51 %
Platelets: 215 10*3/uL (ref 150–450)
RBC: 5.03 x10E6/uL (ref 4.14–5.80)
RDW: 13.1 % (ref 11.6–15.4)
WBC: 5.5 10*3/uL (ref 3.4–10.8)

## 2021-10-25 LAB — FOLLICLE STIMULATING HORMONE: FSH: 7.6 m[IU]/mL (ref 1.5–12.4)

## 2021-10-25 LAB — THYROID PEROXIDASE ANTIBODY: Thyroperoxidase Ab SerPl-aCnc: 12 IU/mL (ref 0–34)

## 2021-10-25 LAB — LUTEINIZING HORMONE: LH: 7.3 m[IU]/mL (ref 1.7–8.6)

## 2021-10-25 LAB — TESTOSTERONE, FREE, TOTAL, SHBG
Sex Hormone Binding: 67.8 nmol/L (ref 19.3–76.4)
Testosterone, Free: 7.6 pg/mL (ref 6.6–18.1)
Testosterone: 369 ng/dL (ref 264–916)

## 2021-10-25 LAB — TSH: TSH: 0.997 u[IU]/mL (ref 0.450–4.500)

## 2021-10-25 LAB — THYROGLOBULIN ANTIBODY: Thyroglobulin Antibody: <1 IU/mL (ref 0.0–0.9)

## 2021-10-25 LAB — PROLACTIN: Prolactin: 9.6 ng/mL (ref 4.0–15.2)

## 2021-10-25 LAB — T4, FREE: Free T4: 1.42 ng/dL (ref 0.82–1.77)

## 2021-10-30 ENCOUNTER — Encounter: Payer: Self-pay | Admitting: "Endocrinology

## 2021-10-30 ENCOUNTER — Ambulatory Visit (INDEPENDENT_AMBULATORY_CARE_PROVIDER_SITE_OTHER): Payer: Medicare Other | Admitting: "Endocrinology

## 2021-10-30 VITALS — BP 130/60 | HR 64 | Ht 69.0 in | Wt 194.4 lb

## 2021-10-30 DIAGNOSIS — E291 Testicular hypofunction: Secondary | ICD-10-CM | POA: Diagnosis not present

## 2021-10-30 DIAGNOSIS — E559 Vitamin D deficiency, unspecified: Secondary | ICD-10-CM

## 2021-10-30 DIAGNOSIS — E039 Hypothyroidism, unspecified: Secondary | ICD-10-CM | POA: Diagnosis not present

## 2021-10-30 DIAGNOSIS — E782 Mixed hyperlipidemia: Secondary | ICD-10-CM

## 2021-10-30 MED ORDER — TADALAFIL 10 MG PO TABS: 10.0000 mg | ORAL_TABLET | ORAL | 1 refills | Status: DC | PRN

## 2021-10-30 NOTE — Progress Notes (Signed)
10/30/2021, 4:40 PM  Endocrinology follow-up note   Subjective:    Patient ID: Edwin Perez, male    DOB: May 30, 1951, PCP Pllc, [facility]   Past Medical History:  Diagnosis Date   BPH (benign prostatic hyperplasia)    Essential hypertension, benign 02/25/2019   HLD (hyperlipidemia) 02/25/2019   Hypertension    Hypothyroidism, adult 02/25/2019   Testicular failure 02/25/2019   Vitamin D deficiency disease 02/25/2019   Past Surgical History:  Procedure Laterality Date   HAND TENDON SURGERY     TONSILLECTOMY     Social History   Socioeconomic History   Marital status: Married    Spouse name: Not on file   Number of children: Not on file   Years of education: Not on file   Highest education level: Not on file  Occupational History   Not on file  Tobacco Use   Smoking status: Former    Packs/day: 1.00    Years: 45.00    Pack years: 45.00    Types: Cigarettes    Start date: 09/15/1970    Quit date: 12/09/2015    Years since quitting: 5.8   Smokeless tobacco: Current    Types: Snuff   Tobacco comments:    has been dipping snuff for 30 years  Substance and Sexual Activity   Alcohol use: Not Currently   Drug use: No   Sexual activity: Not on file  Other Topics Concern   Not on file  Social History Narrative   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   Social Determinants of Health   Financial Resource Strain: Not on file  Food Insecurity: Not on file  Transportation Needs: Not on file  Physical Activity: Not on file  Stress: Not on file  Social Connections: Not on file   History reviewed. No pertinent family history. Outpatient Encounter Medications as of 10/30/2021  Medication Sig   Cholecalciferol (VITAMIN D3 PO) Take 1 tablet by mouth daily.   Cyanocobalamin (VITAMIN B12 PO) Take 1 tablet by mouth daily.   MAGNESIUM CITRATE PO Take 1 tablet by mouth daily.   tadalafil  (CIALIS) 10 MG tablet Take 1 tablet (10 mg total) by mouth every other day as needed for erectile dysfunction.   ZINC OXIDE PO Take 1 tablet by mouth daily.   albuterol (VENTOLIN HFA) 108 (90 Base) MCG/ACT inhaler Inhale 2 puffs into the lungs every 6 (six) hours as needed for wheezing or shortness of breath.   cetirizine (ZYRTEC ALLERGY) 10 MG tablet Take 1 tablet (10 mg total) by mouth daily. (Patient taking differently: Take 10 mg by mouth daily as needed.)   fluticasone (FLONASE) 50 MCG/ACT nasal spray Place 1 spray into both nostrils 2 (two) times daily.   levothyroxine (SYNTHROID) 88 MCG tablet Take 1 tablet (88 mcg total) by mouth daily before breakfast.   Omega-3 Fatty Acids (FISH OIL) 1000 MG CPDR Take 1,000 mg by mouth daily.   [DISCONTINUED] tadalafil (CIALIS) 5 MG tablet Take 1 tablet (5 mg total) by mouth daily.   [DISCONTINUED] Vitamin D, Ergocalciferol, (DRISDOL) 1.25 MG (50000 UNIT) CAPS capsule Take 50,000 Units by mouth once a week.   No facility-administered encounter medications on file  as of 10/30/2021.   ALLERGIES: No Known Allergies  VACCINATION STATUS: Immunization History  Administered Date(s) Administered   Fluad Quad(high Dose 65+) 03/11/2019, 03/11/2019, 03/16/2020   PFIZER(Purple Top)SARS-COV-2 Vaccination 07/23/2019, 08/15/2019    HPI Edwin Perez is 71 y.o. male who presents today with a medical history as above. he is being seen in follow-up after he was seen in consultation for hypothyroidism and hypogonadism requested by [facility].   -See notes from prior visit.  He reports that he was diagnosed with hypothyroidism at approximate age of 79.  He was treated with various dose of thyroid hormone.  He was switched to Synthroid 88 mcg during his last visit, presents with significant symptomatic improvement and previsit labs consistent with appropriate replacement.   He denies prior history of thyroid surgery nor thyroid ablation.  He denies  family history of thyroid dysfunction.  His recent labs show TSH within normal range.  He reports diffuse body aches, heat intolerance.  He denies dysphagia, shortness of breath, nor voice change.  His recent thyroid ultrasound is unremarkable.  He has no discrete nodules, thyroid is average in size.     He also has hypogonadism for which he is on testosterone injection 200 mg twice a week.  Review of his medical records show supraphysiologic testosterone levels more treatment recently 1108 on a random blood test on May 15, 2021 associated with polycythemia with hematocrit of 57, hemoglobin 19.1. 1 other prior measurement showed 1464 total testosterone. He has prior use of bodybuilding androgens/protein preparations.  He denies any prolonged history of use of opioids.  He denies testicular injury, chemotherapy, radiation, nor head injury.  He has 2 grown kids. He notes that he did not know he had supraphysiologic levels of testosterone.  He has medical history of benign prostatic hyperplasia. He uses Cialis for ED.  He is on vitamin D for vitamin D deficiency. He was advised to hold off testosterone supplement until further measurement.  He has not taking any testosterone since last visit.  Previsit labs show testosterone total at 369.  He is previously deranged  CBC profile is now normal.  Patient reports feeling much better symptomatically.   Review of Systems  Constitutional: + Lost 8 pounds since last visit,  no fatigue, no subjective hyperthermia, no subjective hypothermia Eyes: no blurry vision, no xerophthalmia ENT: no sore throat, no nodules palpated in throat, no dysphagia/odynophagia, no hoarseness Cardiovascular: no Chest Pain, no Shortness of Breath, no palpitations, no leg swelling Respiratory: no cough, no shortness of breath Gastrointestinal: no Nausea/Vomiting/Diarhhea Musculoskeletal: no muscle/joint aches Skin: no rashes Neurological: no tremors, no numbness, no  tingling, no dizziness Psychiatric: no depression, no anxiety  Objective:       10/30/2021    3:48 PM 08/01/2021    1:32 PM 05/05/2021   11:16 AM  Vitals with BMI  Height '5\' 9"'$  '5\' 9"'$    Weight 194 lbs 6 oz 202 lbs 10 oz   BMI 24.09 73.53   Systolic 299  242  Diastolic 60  77  Pulse 64 80 72    BP 130/60   Pulse 64   Ht '5\' 9"'$  (1.753 m)   Wt 194 lb 6.4 oz (88.2 kg)   BMI 28.71 kg/m   Wt Readings from Last 3 Encounters:  10/30/21 194 lb 6.4 oz (88.2 kg)  08/01/21 202 lb 9.6 oz (91.9 kg)  05/05/21 195 lb (88.5 kg)    Physical Exam  Constitutional:  Body mass index is  28.71 kg/m.,  not in acute distress, normal state of mind Eyes: PERRLA, EOMI, no exophthalmos ENT: moist mucous membranes, no gross thyromegaly, no gross cervical lymphadenopathy Cardiovascular: normal precordial activity, Regular Rate and Rhythm, no Murmur/Rubs/Gallops Respiratory:  adequate breathing efforts, no gross chest deformity, Clear to auscultation bilaterally Gastrointestinal: abdomen soft, Non -tender, No distension, Bowel Sounds present, no gross organomegaly Musculoskeletal: no gross deformities, strength intact in all four extremities Genital exam: Testicles are shrunk bilaterally to 15 cc.  No scrotal mass, no hernia. Skin: moist, warm, no rashes Neurological: no tremor with outstretched hands, Deep tendon reflexes normal in bilateral lower extremities.  CMP ( most recent) CMP     Component Value Date/Time   NA 137 09/27/2020 1721   K 4.0 09/27/2020 1721   CL 102 09/27/2020 1721   CO2 27 09/27/2020 1721   GLUCOSE 81 09/27/2020 1721   BUN 15 09/27/2020 1721   CREATININE 1.56 (H) 09/27/2020 1721   CALCIUM 9.5 09/27/2020 1721   PROT 6.7 09/07/2020 1144   AST 23 09/07/2020 1144   ALT 17 09/07/2020 1144   BILITOT 0.6 09/07/2020 1144   GFRNONAA 40 (L) 09/07/2020 1144   GFRAA 46 (L) 09/07/2020 1144       Lipid Panel ( most recent) Lipid Panel     Component Value Date/Time   CHOL 203  (H) 09/07/2020 1144   TRIG 260 (H) 09/07/2020 1144   HDL 48 09/07/2020 1144   CHOLHDL 4.2 09/07/2020 1144   LDLCALC 118 (H) 09/07/2020 1144      Lab Results  Component Value Date   TSH 0.997 10/24/2021   TSH 0.80 09/07/2020   TSH 0.70 02/25/2019   FREET4 1.42 10/24/2021   FREET4 0.9 09/07/2020    Total testosterone: 1108, 1464   CBC    Component Value Date/Time   WBC 5.5 10/24/2021 0813   WBC 7.6 09/07/2020 1144   RBC 5.03 10/24/2021 0813   RBC 6.08 (H) 09/07/2020 1144   HGB 15.3 10/24/2021 0813   HCT 44.1 10/24/2021 0813   PLT 215 10/24/2021 0813   MCV 88 10/24/2021 0813   MCH 30.4 10/24/2021 0813   MCH 29.4 09/07/2020 1144   MCHC 34.7 10/24/2021 0813   MCHC 33.4 09/07/2020 1144   RDW 13.1 10/24/2021 0813   LYMPHSABS 1.8 10/24/2021 0813   EOSABS 0.3 10/24/2021 0813   BASOSABS 0.1 10/24/2021 0813   PSA was 2.9 on September 07, 2020.  Assessment & Plan:   1. Hypothyroidism, unspecified type 2.  Hypogonadism 3.  Hyperlipidemia 4.  Vitamin D deficiency  I discussed his recent labs.  His total testosterone is favorable at 369. He will not need testosterone supplement until next measurement.  The circumstances of his diagnosis with hypogonadism are not available to review.  In any case, he is at risk of iatrogenic hyperandrogenism.  I discussed age correlated testosterone target with him.  As a 71 year old man his average testosterone will be between 200-350.Will be considered for reinitiation of testosterone at a low dose only if he drops total testosterone below 250 ng per DL.   His testicular exam reveals chronic hypogonadism likely related to his use of anabolic steroids or androgens for bodybuilding.  He will likely end up requiring some testosterone supplement.  He is not looking for fertility.  He has history of BPH is a relative contraindication, hence, he would benefit from a lower end of the normal range.  -Regarding his hypothyroidism: He was switched to  levothyroxine 88 mcg  daily before breakfast from high-dose NP thyroid during his last visit.   His previsit thyroid function tests are consistent with appropriate replacement.    - We discussed about the correct intake of his thyroid hormone, on empty stomach at fasting, with water, separated by at least 30 minutes from breakfast and other medications,  and separated by more than 4 hours from calcium, iron, multivitamins, acid reflux medications (PPIs). -Patient is made aware of the fact that thyroid hormone replacement is needed for life, dose to be adjusted by periodic monitoring of thyroid function tests.  -His prior CBC measurements also showed polycythemia likely correlated to supraphysiologic testosterone levels.    He is advised to continue vitamin D 50,000 units weekly. -Regarding his dyslipidemia, he is only on omega-3 fatty acids.  He will likely need statin intervention, will be discussed next visit. - he is advised to maintain close follow up with Pllc, [facility] for primary care needs.   I spent 31 minutes in the care of the patient today including review of labs from Thyroid Function, CMP, and other relevant labs ; imaging/biopsy records (current and previous including abstractions from other facilities); face-to-face time discussing  his lab results and symptoms, medications doses, his options of short and long term treatment based on the latest standards of care / guidelines;   and documenting the encounter.  Edwin Perez  participated in the discussions, expressed understanding, and voiced agreement with the above plans.  All questions were answered to his satisfaction. he is encouraged to contact clinic should he have any questions or concerns prior to his return visit.   Follow up plan: Return in about 4 months (around 03/02/2022) for Fasting Labs  in AM B4 8.   Glade Lloyd, MD [facility] [address] [place], [place] [address] Phone: [phone]  Fax: [phone]     10/30/2021, 4:40 PM  This note was partially dictated with voice recognition software. Similar sounding words can be transcribed inadequately or may not  be corrected upon review.

## 2021-11-01 ENCOUNTER — Telehealth: Payer: Self-pay

## 2021-11-01 NOTE — Telephone Encounter (Signed)
Left a message requesting pt return call to the office. ?

## 2021-11-01 NOTE — Telephone Encounter (Signed)
Left a message requesting pt return call to office.

## 2021-11-01 NOTE — Telephone Encounter (Signed)
Patient left a VM stating he is returning your call

## 2021-11-06 NOTE — Telephone Encounter (Signed)
Left a message requesting pt return call to the office. ?

## 2021-11-07 ENCOUNTER — Other Ambulatory Visit: Payer: Self-pay | Admitting: "Endocrinology

## 2021-11-07 ENCOUNTER — Other Ambulatory Visit: Payer: Self-pay

## 2021-11-07 ENCOUNTER — Telehealth: Payer: Self-pay

## 2021-11-07 DIAGNOSIS — R718 Other abnormality of red blood cells: Secondary | ICD-10-CM | POA: Diagnosis not present

## 2021-11-07 DIAGNOSIS — E039 Hypothyroidism, unspecified: Secondary | ICD-10-CM

## 2021-11-07 DIAGNOSIS — E291 Testicular hypofunction: Secondary | ICD-10-CM | POA: Diagnosis not present

## 2021-11-07 MED ORDER — TADALAFIL 5 MG PO TABS: 5.0000 mg | ORAL_TABLET | ORAL | 2 refills | Status: AC | PRN

## 2021-11-07 MED ORDER — LEVOTHYROXINE SODIUM 88 MCG PO TABS: 88.0000 ug | ORAL_TABLET | Freq: Every day | ORAL | 1 refills | Status: DC

## 2021-11-07 NOTE — Telephone Encounter (Signed)
Pt states tadalifil '10mg'$  is to costly. Requested Rx for the '5mg'$  dose. Uses [facility].

## 2021-11-13 DIAGNOSIS — Z79899 Other long term (current) drug therapy: Secondary | ICD-10-CM | POA: Diagnosis not present

## 2021-11-13 DIAGNOSIS — E291 Testicular hypofunction: Secondary | ICD-10-CM | POA: Diagnosis not present

## 2021-11-13 DIAGNOSIS — E039 Hypothyroidism, unspecified: Secondary | ICD-10-CM | POA: Diagnosis not present

## 2021-11-13 IMAGING — DX DG TIBIA/FIBULA PORT 2V*R*
4 series · 4 of 4 positions shown · non-contrast
Comparison: None.

CLINICAL DATA: Right leg pain

EXAM:
PORTABLE RIGHT TIBIA AND FIBULA - 2 VIEW

[tibia ap (1 of 2)]
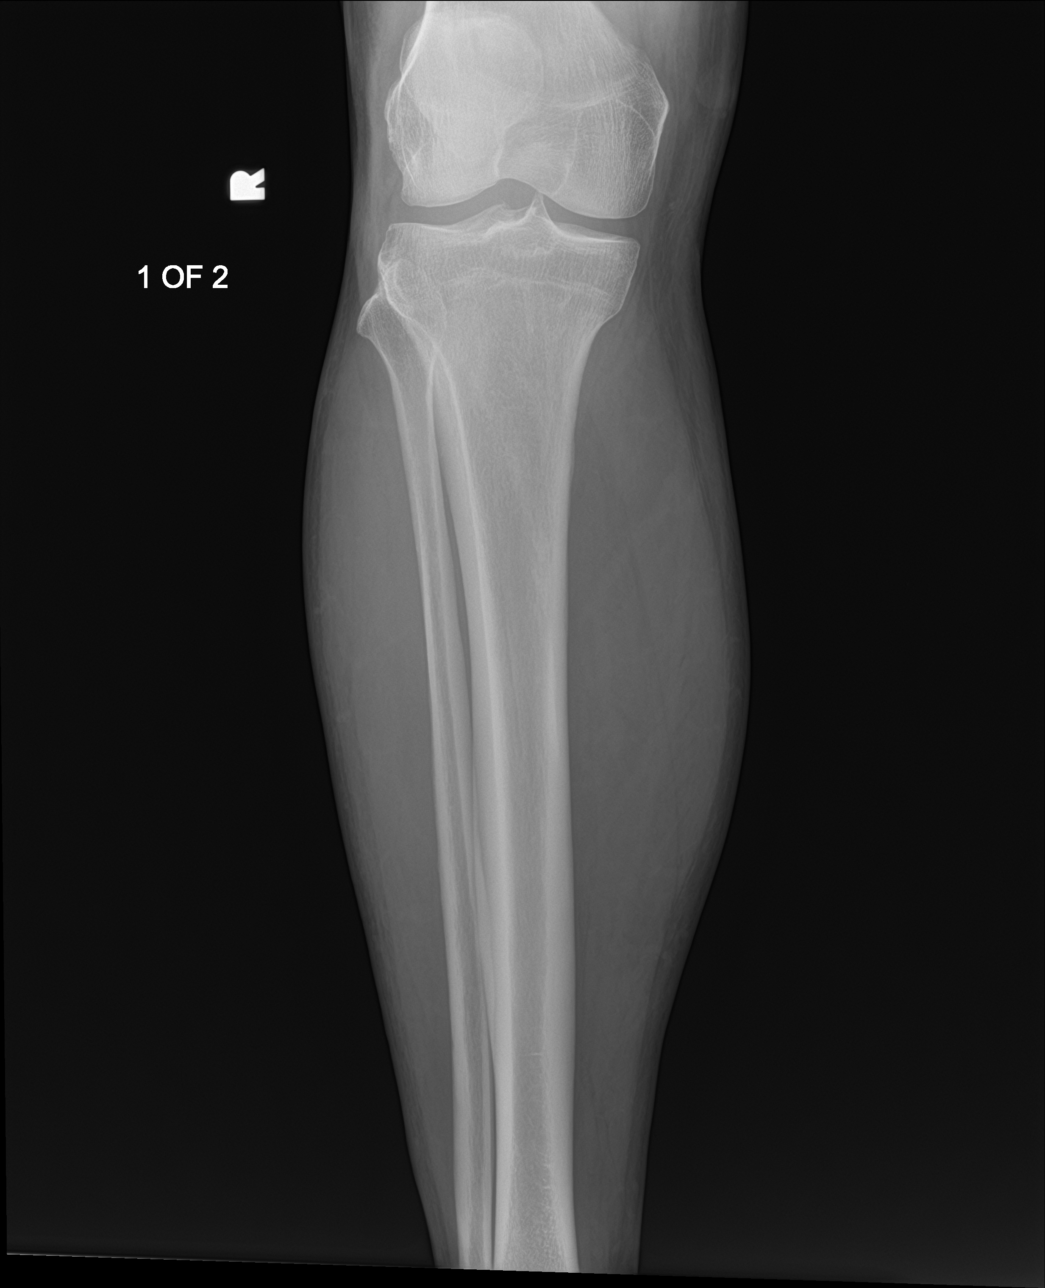

[tibia ap (2 of 2)]
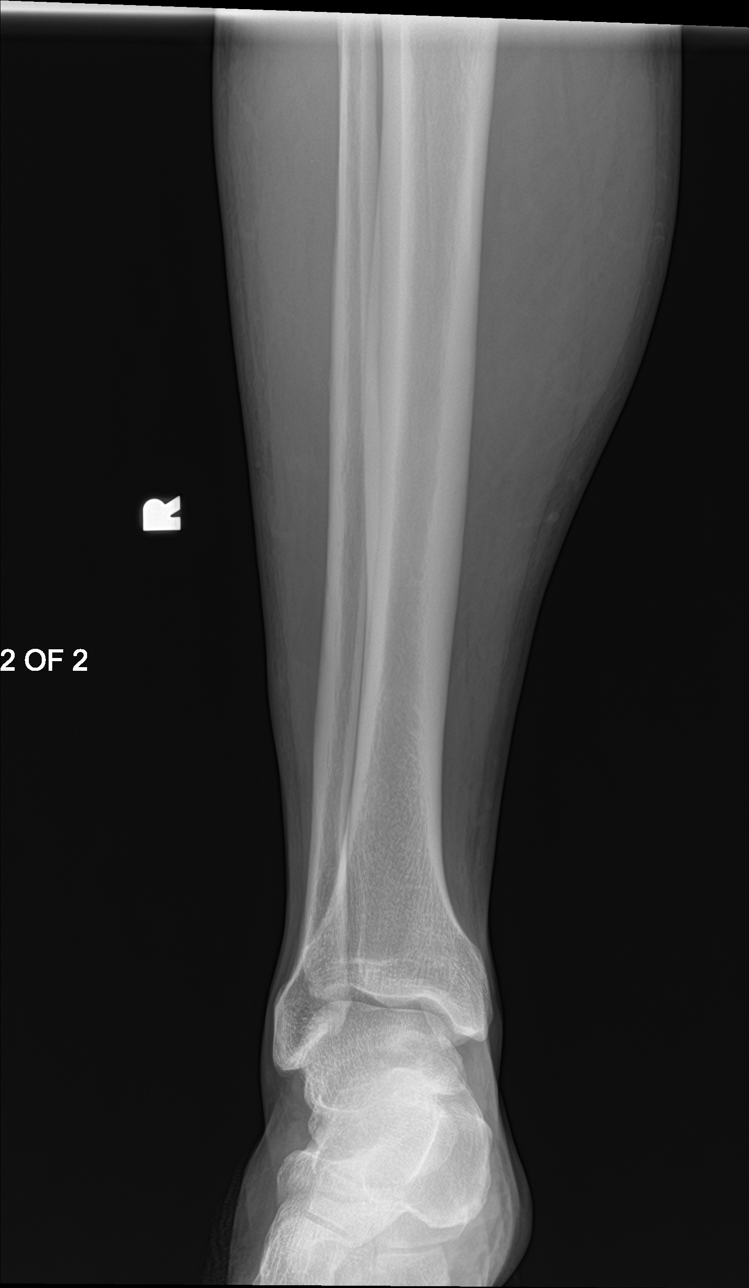

[tibia lat (1 of 2)]
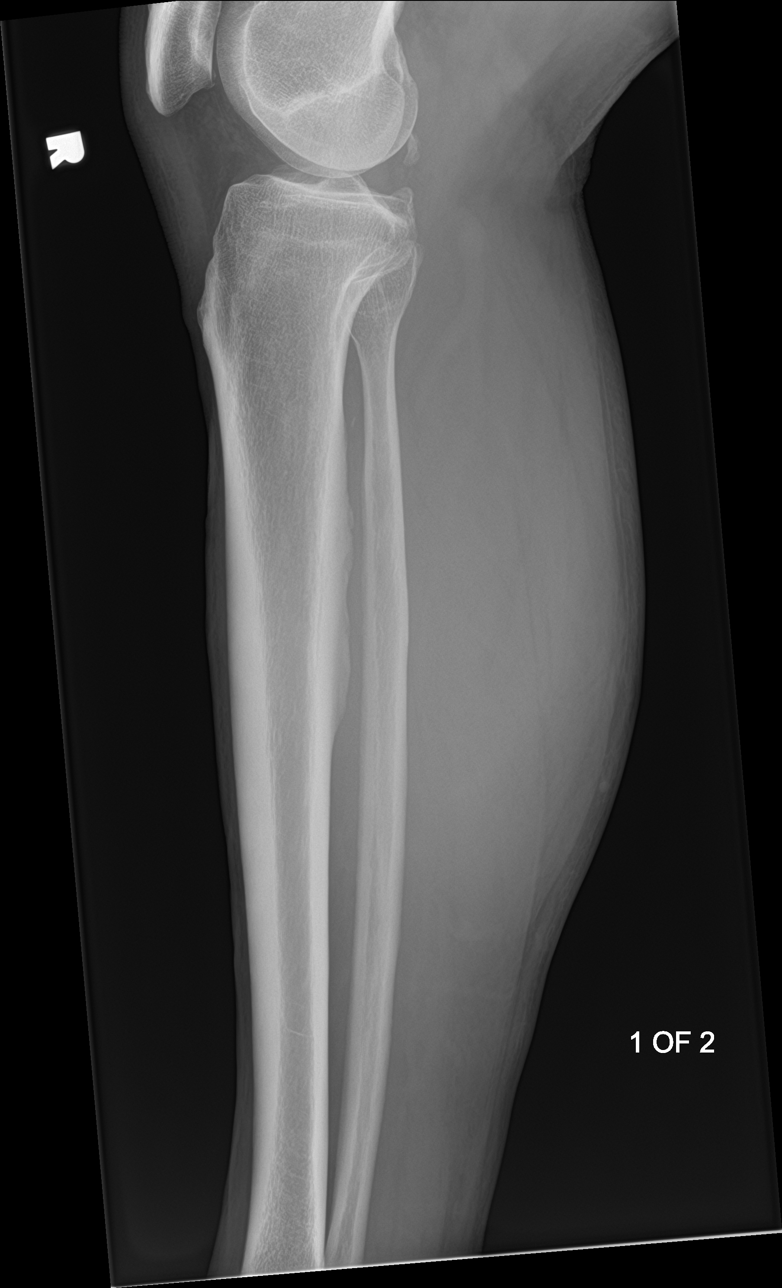

[tibia lat (2 of 2)]
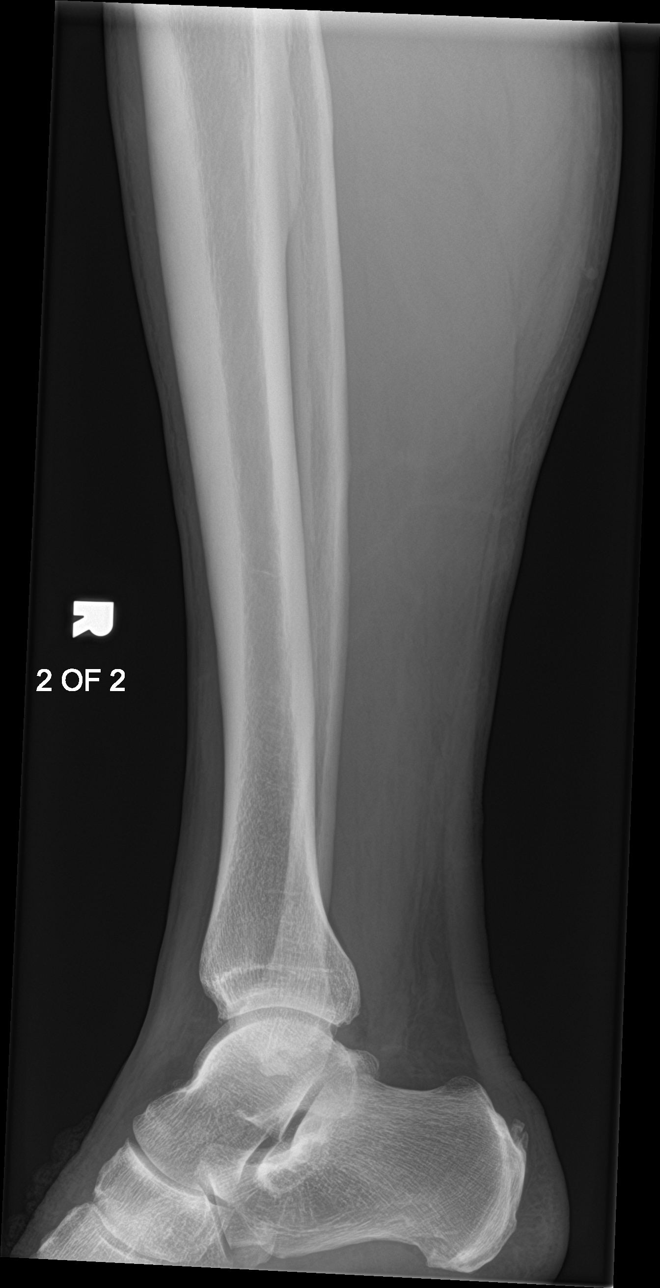

[4 of 4 positions shown; findings below may reference images not displayed]

FINDINGS: There is no evidence of fracture or other focal bone lesions. Soft
tissues are unremarkable.
IMPRESSION: Negative.

## 2022-01-03 DIAGNOSIS — R531 Weakness: Secondary | ICD-10-CM | POA: Diagnosis not present

## 2022-01-03 DIAGNOSIS — J302 Other seasonal allergic rhinitis: Secondary | ICD-10-CM | POA: Diagnosis not present

## 2022-01-03 DIAGNOSIS — R5383 Other fatigue: Secondary | ICD-10-CM | POA: Diagnosis not present

## 2022-01-03 DIAGNOSIS — E039 Hypothyroidism, unspecified: Secondary | ICD-10-CM | POA: Diagnosis not present

## 2022-01-03 DIAGNOSIS — E559 Vitamin D deficiency, unspecified: Secondary | ICD-10-CM | POA: Diagnosis not present

## 2022-01-03 DIAGNOSIS — R718 Other abnormality of red blood cells: Secondary | ICD-10-CM | POA: Diagnosis not present

## 2022-01-03 DIAGNOSIS — Z79899 Other long term (current) drug therapy: Secondary | ICD-10-CM | POA: Diagnosis not present

## 2022-01-03 DIAGNOSIS — E291 Testicular hypofunction: Secondary | ICD-10-CM | POA: Diagnosis not present

## 2022-01-10 DIAGNOSIS — E291 Testicular hypofunction: Secondary | ICD-10-CM | POA: Diagnosis not present

## 2022-01-10 DIAGNOSIS — Z79899 Other long term (current) drug therapy: Secondary | ICD-10-CM | POA: Diagnosis not present

## 2022-01-10 DIAGNOSIS — E559 Vitamin D deficiency, unspecified: Secondary | ICD-10-CM | POA: Diagnosis not present

## 2022-01-10 DIAGNOSIS — E039 Hypothyroidism, unspecified: Secondary | ICD-10-CM | POA: Diagnosis not present

## 2022-01-17 DIAGNOSIS — F32A Depression, unspecified: Secondary | ICD-10-CM | POA: Diagnosis not present

## 2022-01-17 DIAGNOSIS — E663 Overweight: Secondary | ICD-10-CM | POA: Diagnosis not present

## 2022-01-17 DIAGNOSIS — R5383 Other fatigue: Secondary | ICD-10-CM | POA: Diagnosis not present

## 2022-02-05 DIAGNOSIS — D485 Neoplasm of uncertain behavior of skin: Secondary | ICD-10-CM | POA: Diagnosis not present

## 2022-02-05 DIAGNOSIS — L570 Actinic keratosis: Secondary | ICD-10-CM | POA: Diagnosis not present

## 2022-02-19 DIAGNOSIS — F321 Major depressive disorder, single episode, moderate: Secondary | ICD-10-CM | POA: Diagnosis not present

## 2022-02-19 DIAGNOSIS — R5383 Other fatigue: Secondary | ICD-10-CM | POA: Diagnosis not present

## 2022-02-25 DIAGNOSIS — E291 Testicular hypofunction: Secondary | ICD-10-CM | POA: Diagnosis not present

## 2022-02-25 DIAGNOSIS — E039 Hypothyroidism, unspecified: Secondary | ICD-10-CM | POA: Diagnosis not present

## 2022-03-04 LAB — T4, FREE: Free T4: 1.57 ng/dL (ref 0.82–1.77)

## 2022-03-04 LAB — TESTOSTERONE, FREE, TOTAL, SHBG
Sex Hormone Binding: 58.3 nmol/L (ref 19.3–76.4)
Testosterone, Free: 4.5 pg/mL — ABNORMAL LOW (ref 6.6–18.1)
Testosterone: 386 ng/dL (ref 264–916)

## 2022-03-04 LAB — TSH: TSH: 0.697 u[IU]/mL (ref 0.450–4.500)

## 2022-03-05 ENCOUNTER — Ambulatory Visit (INDEPENDENT_AMBULATORY_CARE_PROVIDER_SITE_OTHER): Payer: Medicare Other | Admitting: "Endocrinology

## 2022-03-05 ENCOUNTER — Encounter: Payer: Self-pay | Admitting: "Endocrinology

## 2022-03-05 VITALS — BP 112/72 | HR 60 | Ht 69.0 in | Wt 204.0 lb

## 2022-03-05 DIAGNOSIS — E039 Hypothyroidism, unspecified: Secondary | ICD-10-CM | POA: Diagnosis not present

## 2022-03-05 DIAGNOSIS — E782 Mixed hyperlipidemia: Secondary | ICD-10-CM | POA: Diagnosis not present

## 2022-03-05 DIAGNOSIS — E291 Testicular hypofunction: Secondary | ICD-10-CM | POA: Diagnosis not present

## 2022-03-05 NOTE — Progress Notes (Signed)
03/05/2022, 5:24 PM  Endocrinology follow-up note   Subjective:    Patient ID: Edwin Perez, male    DOB: Feb 19, 1951, PCP Edwin Perez, [facility]   Past Medical History:  Diagnosis Date   BPH (benign prostatic hyperplasia)    Essential hypertension, benign 02/25/2019   HLD (hyperlipidemia) 02/25/2019   Hypertension    Hypothyroidism, adult 02/25/2019   Testicular failure 02/25/2019   Vitamin D deficiency disease 02/25/2019   Past Surgical History:  Procedure Laterality Date   HAND TENDON SURGERY     TONSILLECTOMY     Social History   Socioeconomic History   Marital status: Married    Spouse name: Not on file   Number of children: Not on file   Years of education: Not on file   Highest education level: Not on file  Occupational History   Not on file  Tobacco Use   Smoking status: Former    Packs/day: 1.00    Years: 45.00    Total pack years: 45.00    Types: Cigarettes    Start date: 09/15/1970    Quit date: 12/09/2015    Years since quitting: 6.2   Smokeless tobacco: Current    Types: Snuff   Tobacco comments:    has been dipping snuff for 30 years  Substance and Sexual Activity   Alcohol use: Not Currently   Drug use: No   Sexual activity: Not on file  Other Topics Concern   Not on file  Social History Narrative   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   Social Determinants of Health   Financial Resource Strain: Not on file  Food Insecurity: Not on file  Transportation Needs: Not on file  Physical Activity: Not on file  Stress: Not on file  Social Connections: Not on file   History reviewed. No pertinent family history. Outpatient Encounter Medications as of 03/05/2022  Medication Sig   Multiple Vitamins-Minerals (MULTIVITAMIN ADULTS PO) Take 1 tablet by mouth daily.   albuterol (VENTOLIN HFA) 108 (90 Base) MCG/ACT inhaler Inhale 2 puffs into the lungs every 6 (six)  hours as needed for wheezing or shortness of breath. (Patient not taking: Reported on 03/05/2022)   Cholecalciferol (VITAMIN D3 PO) Take 1 tablet by mouth daily.   escitalopram (LEXAPRO) 20 MG tablet Take 20 mg by mouth daily.   fluticasone (FLONASE) 50 MCG/ACT nasal spray Place 1 spray into both nostrils 2 (two) times daily.   levothyroxine (SYNTHROID) 88 MCG tablet Take 1 tablet (88 mcg total) by mouth daily before breakfast.   Omega-3 Fatty Acids (FISH OIL) 1000 MG CPDR Take 1,000 mg by mouth daily.   tadalafil (CIALIS) 5 MG tablet Take 1 tablet (5 mg total) by mouth as needed for erectile dysfunction.   [DISCONTINUED] cetirizine (ZYRTEC ALLERGY) 10 MG tablet Take 1 tablet (10 mg total) by mouth daily. (Patient taking differently: Take 10 mg by mouth daily as needed.)   [DISCONTINUED] Cyanocobalamin (VITAMIN B12 PO) Take 1 tablet by mouth daily.   [DISCONTINUED] MAGNESIUM CITRATE PO Take 1 tablet by mouth daily.   [DISCONTINUED] ZINC OXIDE PO Take 1 tablet by mouth daily.   No facility-administered encounter medications on file as of 03/05/2022.  ALLERGIES: No Known Allergies  VACCINATION STATUS: Immunization History  Administered Date(s) Administered   Fluad Quad(high Dose 65+) 03/11/2019, 03/11/2019, 03/16/2020   PFIZER(Purple Top)SARS-COV-2 Vaccination 07/23/2019, 08/15/2019    HPI Edwin Perez is 71 y.o. male who presents today with a medical history as above. he is being seen in follow-up after he was seen in consultation for hypothyroidism and hypogonadism requested by Edwin Perez, Edwin Perez.  He was found to have iatrogenic hypogonadism during his prior visits at which time he was advised to stay off of testosterone supplements.  He returns with stable total testosterone 389 without any testosterone supplements. -See notes from prior visit.  He reports that he was diagnosed with hypothyroidism at approximate age of 84.  He was treated with various dose of thyroid hormone.  He  was switched to controlled 88 mcg p.o. daily before breakfast.  He continues to have mild fatigue, however does not have cold intolerance, has steady weight.  He denies dysphagia, shortness of breath, nor voice change.     He denies prior history of thyroid surgery nor thyroid ablation.  He denies family history of thyroid dysfunction.  His recent labs show TSH within normal range.  He reports diffuse body aches, heat intolerance.  He denies dysphagia, shortness of breath, nor voice change.  His recent thyroid ultrasound is unremarkable.  He has no discrete nodules, thyroid is average in size.    He did have previous polycythemia with hematocrit of 57, hemoglobin 19.1. 1 other prior measurement showed 1464 total testosterone. He has prior use of bodybuilding androgens/protein preparations.  He denies any prolonged history of use of opioids.  He denies testicular injury, chemotherapy, radiation, nor head injury.  He has 2 grown kids. He notes that he did not know he had supraphysiologic levels of testosterone.  He has medical history of benign prostatic hyperplasia. He uses Cialis for ED.  He is on vitamin D for vitamin D deficiency. He was advised to hold off testosterone supplement until further measurement.  He has not taking any testosterone since last visit.  Previsit labs show testosterone total at 389.  He is previously deranged  CBC profile is now normal.  Patient reports feeling much better symptomatically.   Review of Systems  Constitutional: +Steady weight since last visit, + fatigue, no subjective hyperthermia, no subjective hypothermia   Objective:       03/05/2022    3:54 PM 10/30/2021    3:48 PM 08/01/2021    1:32 PM  Vitals with BMI  Height '5\' 9"'$  '5\' 9"'$  '5\' 9"'$   Weight 204 lbs 194 lbs 6 oz 202 lbs 10 oz  BMI 30.11 99.37 16.96  Systolic 789 381   Diastolic 72 60   Pulse 60 64 80    BP 112/72   Pulse 60   Ht '5\' 9"'$  (1.753 m)   Wt 204 lb (92.5 kg)   BMI 30.13 kg/m   Wt  Readings from Last 3 Encounters:  03/05/22 204 lb (92.5 kg)  10/30/21 194 lb 6.4 oz (88.2 kg)  08/01/21 202 lb 9.6 oz (91.9 kg)    Physical Exam  Constitutional:  Body mass index is 30.13 kg/m.,  not in acute distress, normal state of mind Eyes: PERRLA, EOMI, no exophthalmos  Genital exam: Testicles are shrunk bilaterally to 15 cc.  No scrotal mass, no hernia.   CMP ( most recent) CMP     Component Value Date/Time   NA 137 09/27/2020 1721   K 4.0 09/27/2020  1721   CL 102 09/27/2020 1721   CO2 27 09/27/2020 1721   GLUCOSE 81 09/27/2020 1721   BUN 15 09/27/2020 1721   CREATININE 1.56 (H) 09/27/2020 1721   CALCIUM 9.5 09/27/2020 1721   PROT 6.7 09/07/2020 1144   AST 23 09/07/2020 1144   ALT 17 09/07/2020 1144   BILITOT 0.6 09/07/2020 1144   GFRNONAA 40 (L) 09/07/2020 1144   GFRAA 46 (L) 09/07/2020 1144       Lipid Panel ( most recent) Lipid Panel     Component Value Date/Time   CHOL 203 (H) 09/07/2020 1144   TRIG 260 (H) 09/07/2020 1144   HDL 48 09/07/2020 1144   CHOLHDL 4.2 09/07/2020 1144   LDLCALC 118 (H) 09/07/2020 1144       Total testosterone: 1108, 1464   CBC    Component Value Date/Time   WBC 5.5 10/24/2021 0813   WBC 7.6 09/07/2020 1144   RBC 5.03 10/24/2021 0813   RBC 6.08 (H) 09/07/2020 1144   HGB 15.3 10/24/2021 0813   HCT 44.1 10/24/2021 0813   PLT 215 10/24/2021 0813   MCV 88 10/24/2021 0813   MCH 30.4 10/24/2021 0813   MCH 29.4 09/07/2020 1144   MCHC 34.7 10/24/2021 0813   MCHC 33.4 09/07/2020 1144   RDW 13.1 10/24/2021 0813   LYMPHSABS 1.8 10/24/2021 0813   EOSABS 0.3 10/24/2021 0813   BASOSABS 0.1 10/24/2021 0813   PSA was 2.9 on September 07, 2020.  Recent Results (from the past 2160 hour(s))  Testosterone, Free, Total, SHBG     Status: Abnormal   Collection Time: 02/25/22  8:26 AM  Result Value Ref Range   Testosterone 386 264 - 916 ng/dL    Comment: Adult male reference interval is based on a population of healthy nonobese  males (BMI <30) between 49 and 30 years old. [place], [place] [phone]. PMID: [id number].    Testosterone, Free 4.5 (L) 6.6 - 18.1 pg/mL   Sex Hormone Binding 58.3 19.3 - 76.4 nmol/L  TSH     Status: None   Collection Time: 02/25/22  8:26 AM  Result Value Ref Range   TSH 0.697 0.450 - 4.500 uIU/mL  T4, free     Status: None   Collection Time: 02/25/22  8:26 AM  Result Value Ref Range   Free T4 1.57 0.82 - 1.77 ng/dL     Assessment & Plan:   1. Hypothyroidism, unspecified type 2.  Hypogonadism 3.  Hyperlipidemia 4.  Vitamin D deficiency  I discussed his recent labs.  His total testosterone is favorable at 389. He will not need testosterone supplement at this time.  The circumstances of his diagnosis with hypogonadism are not available to review.  In any case, he was found with iatrogenic hypogonadism at first consult.    I discussed age correlated testosterone target with him.  As a 71 year old man his average testosterone will be between 200-350.Will be considered for reinitiation of testosterone at a low dose only if he drops total testosterone below 250 ng per DL. He has risk factors including prior use of steroids/androgens for bodybuilding, will need continuous follow-up on his testosterone.   -Regarding his hypothyroidism: He was switched to levothyroxine 88 mcg daily before breakfast from high-dose NP thyroid during his last visit.   His previsit thyroid function tests are consistent with appropriate replacement.    - We discussed about the correct intake of his thyroid hormone, on empty stomach at fasting, with water, separated by  at least 30 minutes from breakfast and other medications,  and separated by more than 4 hours from calcium, iron, multivitamins, acid reflux medications (PPIs). -Patient is made aware of the fact that thyroid hormone replacement is needed for life, dose to be adjusted by periodic monitoring of thyroid function tests.  -His prior CBC  measurements also showed polycythemia likely correlated to supraphysiologic testosterone levels.    He is advised to continue vitamin D 50,000 units weekly. -Regarding his dyslipidemia, he is only on omega-3 fatty acids.  He will likely need statin intervention, will be discussed next visit.  He wishes to avoid.  Whole food plant-based diet was discussed and recommended to him.      - he is advised to maintain close follow up with Edwin Perez, [facility] for primary care needs.   I spent 31 minutes in the care of the patient today including review of labs from Thyroid Function, CMP, and other relevant labs ; imaging/biopsy records (current and previous including abstractions from other facilities); face-to-face time discussing  his lab results and symptoms, medications doses, his options of short and long term treatment based on the latest standards of care / guidelines;   and documenting the encounter.  Edwin Perez  participated in the discussions, expressed understanding, and voiced agreement with the above plans.  All questions were answered to his satisfaction. he is encouraged to contact Perez should he have any questions or concerns prior to his return visit.    Follow up plan: Return in about 6 months (around 09/03/2022) for Fasting Labs  in AM B4 8.   Glade Lloyd, MD [facility] [address] [place], [place] [address] Phone: [phone]  Fax: [phone]     03/05/2022, 5:24 PM  This note was partially dictated with voice recognition software. Similar sounding words can be transcribed inadequately or may not  be corrected upon review.

## 2022-03-14 DIAGNOSIS — Z23 Encounter for immunization: Secondary | ICD-10-CM | POA: Diagnosis not present

## 2022-03-26 DIAGNOSIS — Z23 Encounter for immunization: Secondary | ICD-10-CM | POA: Diagnosis not present

## 2022-04-11 DIAGNOSIS — F321 Major depressive disorder, single episode, moderate: Secondary | ICD-10-CM | POA: Diagnosis not present

## 2022-04-11 DIAGNOSIS — Z0000 Encounter for general adult medical examination without abnormal findings: Secondary | ICD-10-CM | POA: Diagnosis not present

## 2022-05-06 DIAGNOSIS — E291 Testicular hypofunction: Secondary | ICD-10-CM | POA: Diagnosis not present

## 2022-05-06 DIAGNOSIS — R531 Weakness: Secondary | ICD-10-CM | POA: Diagnosis not present

## 2022-05-06 DIAGNOSIS — Z13220 Encounter for screening for lipoid disorders: Secondary | ICD-10-CM | POA: Diagnosis not present

## 2022-05-06 DIAGNOSIS — Z79899 Other long term (current) drug therapy: Secondary | ICD-10-CM | POA: Diagnosis not present

## 2022-05-06 DIAGNOSIS — E039 Hypothyroidism, unspecified: Secondary | ICD-10-CM | POA: Diagnosis not present

## 2022-05-06 DIAGNOSIS — Z131 Encounter for screening for diabetes mellitus: Secondary | ICD-10-CM | POA: Diagnosis not present

## 2022-05-14 DIAGNOSIS — E291 Testicular hypofunction: Secondary | ICD-10-CM | POA: Diagnosis not present

## 2022-05-14 DIAGNOSIS — E7849 Other hyperlipidemia: Secondary | ICD-10-CM | POA: Diagnosis not present

## 2022-05-14 DIAGNOSIS — D751 Secondary polycythemia: Secondary | ICD-10-CM | POA: Diagnosis not present

## 2022-05-14 DIAGNOSIS — J302 Other seasonal allergic rhinitis: Secondary | ICD-10-CM | POA: Diagnosis not present

## 2022-05-14 DIAGNOSIS — N178 Other acute kidney failure: Secondary | ICD-10-CM | POA: Diagnosis not present

## 2022-05-14 DIAGNOSIS — E038 Other specified hypothyroidism: Secondary | ICD-10-CM | POA: Diagnosis not present

## 2022-06-12 ENCOUNTER — Other Ambulatory Visit: Payer: Self-pay | Admitting: "Endocrinology

## 2022-06-12 DIAGNOSIS — E039 Hypothyroidism, unspecified: Secondary | ICD-10-CM

## 2022-08-08 ENCOUNTER — Encounter: Payer: Self-pay | Admitting: Radiology

## 2022-08-25 LAB — TESTOSTERONE, FREE, TOTAL, SHBG
Sex Hormone Binding: 82.7 nmol/L — ABNORMAL HIGH (ref 19.3–76.4)
Testosterone, Free: 5.8 pg/mL — ABNORMAL LOW (ref 6.6–18.1)
Testosterone: 531 ng/dL (ref 264–916)

## 2022-08-25 LAB — LIPID PANEL
Chol/HDL Ratio: 4 ratio (ref 0.0–5.0)
Cholesterol, Total: 198 mg/dL (ref 100–199)
HDL: 50 mg/dL (ref >39)
LDL Chol Calc (NIH): 130 mg/dL — ABNORMAL HIGH (ref 0–99)
Triglycerides: 98 mg/dL (ref 0–149)
VLDL Cholesterol Cal: 18 mg/dL (ref 5–40)

## 2022-08-25 LAB — TSH: TSH: 0.172 u[IU]/mL — ABNORMAL LOW (ref 0.450–4.500)

## 2022-08-25 LAB — T4, FREE: Free T4: 1.51 ng/dL (ref 0.82–1.77)

## 2022-09-01 ENCOUNTER — Ambulatory Visit
Admission: EM | Admit: 2022-09-01 | Discharge: 2022-09-01 | Disposition: A | Discharge status: Home / Self Care | Payer: Medicare Other | Attending: Family Medicine | Admitting: Family Medicine

## 2022-09-01 DIAGNOSIS — J069 Acute upper respiratory infection, unspecified: Secondary | ICD-10-CM | POA: Diagnosis not present

## 2022-09-01 MED ORDER — PROMETHAZINE-DM 6.25-15 MG/5ML PO SYRP: 5.0000 mL | ORAL_SOLUTION | Freq: Four times a day (QID) | ORAL | 0 refills | Status: DC | PRN

## 2022-09-01 MED ORDER — FLUTICASONE PROPIONATE 50 MCG/ACT NA SUSP: 1.0000 | Freq: Two times a day (BID) | NASAL | 2 refills | Status: AC

## 2022-09-01 NOTE — ED Triage Notes (Signed)
Pt reports he has a cough, felt dizzy, congestion, and has stopped up ears x 3 days Took claritin and nasal flush but no relief.

## 2022-09-01 NOTE — Discharge Instructions (Signed)
You may use coricidin HBP, saline sinus rinses, continue allergy medication and drink plenty of fluids to help in addition to what has been prescribed for you

## 2022-09-01 NOTE — ED Provider Notes (Signed)
[facility]    CSN: [id number] Arrival date & time: 09/01/22  0805      History   Chief Complaint No chief complaint on file.   HPI Zaequan Harton is a 72 y.o. male.   Presenting today with 3-day history of cough, nasal congestion, bilateral ear pressure and had a bit of mild dizziness several days ago.  Denies fever, chills, body aches, chest pain, shortness of breath, abdominal pain, nausea vomiting or diarrhea.  So far trying Claritin and nasal saline with no relief.  No known sick contacts recently.    Past Medical History:  Diagnosis Date   BPH (benign prostatic hyperplasia)    Essential hypertension, benign 02/25/2019   HLD (hyperlipidemia) 02/25/2019   Hypertension    Hypothyroidism, adult 02/25/2019   Testicular failure 02/25/2019   Vitamin D deficiency disease 02/25/2019    Patient Active Problem List   Diagnosis Date Noted   Essential hypertension, benign 02/25/2019   Hypogonadism, male 02/25/2019   Hypothyroidism 02/25/2019   Vitamin D deficiency 02/25/2019   Mixed hyperlipidemia 02/25/2019    Past Surgical History:  Procedure Laterality Date   HAND TENDON SURGERY     TONSILLECTOMY         Home Medications    Prior to Admission medications   Medication Sig Start Date End Date Taking? Authorizing Provider  promethazine-dextromethorphan (PROMETHAZINE-DM) 6.25-15 MG/5ML syrup Take 5 mLs by mouth 4 (four) times daily as needed. 09/01/22  Yes Volney American, PA-C  albuterol (VENTOLIN HFA) 108 (90 Base) MCG/ACT inhaler Inhale 2 puffs into the lungs every 6 (six) hours as needed for wheezing or shortness of breath. Patient not taking: Reported on 03/05/2022 05/05/21   Fransico Meadow, PA-C  Cholecalciferol (VITAMIN D3 PO) Take 1 tablet by mouth daily.    [provider]  escitalopram (LEXAPRO) 20 MG tablet Take 20 mg by mouth daily. 02/19/22   [provider]  fluticasone (FLONASE) 50 MCG/ACT nasal spray Place 1 spray into  both nostrils 2 (two) times daily. 09/01/22   Volney American, PA-C  levothyroxine (SYNTHROID) 88 MCG tablet TAKE 1 TABLET DAILY BEFORE BREAKFAST 06/12/22   Cassandria Anger, MD  Multiple Vitamins-Minerals (MULTIVITAMIN ADULTS PO) Take 1 tablet by mouth daily.    [provider]  Omega-3 Fatty Acids (FISH OIL) 1000 MG CPDR Take 1,000 mg by mouth daily.    [provider]  tadalafil (CIALIS) 5 MG tablet Take 1 tablet (5 mg total) by mouth as needed for erectile dysfunction. 11/07/21   Cassandria Anger, MD    Family History History reviewed. No pertinent family history.  Social History Social History   Tobacco Use   Smoking status: Former    Packs/day: 1.00    Years: 45.00    Additional pack years: 0.00    Total pack years: 45.00    Types: Cigarettes    Start date: 09/15/1970    Quit date: 12/09/2015    Years since quitting: 6.7   Smokeless tobacco: Current    Types: Snuff   Tobacco comments:    has been dipping snuff for 30 years  Substance Use Topics   Alcohol use: Not Currently   Drug use: No     Allergies   Patient has no known allergies.   Review of Systems Review of Systems Per HPI  Physical Exam Triage Vital Signs ED Triage Vitals  Enc Vitals Group     BP 09/01/22 0815 107/74     Pulse  Rate 09/01/22 0815 89     Resp 09/01/22 0815 20     Temp 09/01/22 0815 98.1 F (36.7 C)     Temp Source 09/01/22 0815 Oral     SpO2 09/01/22 0815 96 %     Weight --      Height --      Head Circumference --      Peak Flow --      Pain Score 09/01/22 0817 0     Pain Loc --      Pain Edu? --      Excl. in [place]? --    No data found.  Updated Vital Signs BP 107/74 (BP Location: Right Arm)   Pulse 89   Temp 98.1 F (36.7 C) (Oral)   Resp 20   SpO2 96%   Visual Acuity Right Eye Distance:   Left Eye Distance:   Bilateral Distance:    Right Eye Near:   Left Eye Near:    Bilateral Near:     Physical Exam Vitals and nursing note  reviewed.  Constitutional:      Appearance: He is well-developed.  HENT:     Head: Atraumatic.     Right Ear: External ear normal.     Left Ear: External ear normal.     Nose: Rhinorrhea present.     Mouth/Throat:     Pharynx: Posterior oropharyngeal erythema present. No oropharyngeal exudate.  Eyes:     Conjunctiva/sclera: Conjunctivae normal.     Pupils: Pupils are equal, round, and reactive to light.  Cardiovascular:     Rate and Rhythm: Normal rate and regular rhythm.  Pulmonary:     Effort: Pulmonary effort is normal. No respiratory distress.     Breath sounds: No wheezing or rales.  Musculoskeletal:        General: Normal range of motion.     Cervical back: Normal range of motion and neck supple.  Lymphadenopathy:     Cervical: No cervical adenopathy.  Skin:    General: Skin is warm and dry.  Neurological:     Mental Status: He is alert and oriented to person, place, and time.  Psychiatric:        Behavior: Behavior normal.      UC Treatments / Results  Labs (all labs ordered are listed, but only abnormal results are displayed) Labs Reviewed - No data to display  EKG   Radiology No results found.  Procedures Procedures (including critical care time)  Medications Ordered in UC Medications - No data to display  Initial Impression / Assessment and Plan / UC Course  I have reviewed the triage vital signs and the nursing notes.  Pertinent labs & imaging results that were available during my care of the patient were reviewed by me and considered in my medical decision making (see chart for details).     Vitals and exam overall reassuring and suggestive of a viral upper respiratory infection.  Declines viral testing today, treat symptomatically with Phenergan DM, Flonase, Coricidin HBP, supportive home care.  Return for worsening symptoms.  Final Clinical Impressions(s) / UC Diagnoses   Final diagnoses:  Viral URI with cough     Discharge Instructions       You may use coricidin HBP, saline sinus rinses, continue allergy medication and drink plenty of fluids to help in addition to what has been prescribed for you    ED Prescriptions     Medication Sig Dispense Auth. Provider   fluticasone (  FLONASE) 50 MCG/ACT nasal spray Place 1 spray into both nostrils 2 (two) times daily. [place], [place]   promethazine-dextromethorphan (PROMETHAZINE-DM) 6.25-15 MG/5ML syrup Take 5 mLs by mouth 4 (four) times daily as needed. 100 mL Volney American, [place]      PDMP not reviewed this encounter.   Merrie Roof [place], [place] 09/01/22 [id number]

## 2022-09-03 ENCOUNTER — Encounter: Payer: Self-pay | Admitting: "Endocrinology

## 2022-09-03 ENCOUNTER — Ambulatory Visit (INDEPENDENT_AMBULATORY_CARE_PROVIDER_SITE_OTHER): Payer: Medicare Other | Admitting: "Endocrinology

## 2022-09-03 VITALS — BP 118/76 | HR 68 | Ht 69.0 in | Wt 199.2 lb

## 2022-09-03 DIAGNOSIS — E291 Testicular hypofunction: Secondary | ICD-10-CM

## 2022-09-03 DIAGNOSIS — E039 Hypothyroidism, unspecified: Secondary | ICD-10-CM | POA: Diagnosis not present

## 2022-09-03 DIAGNOSIS — E782 Mixed hyperlipidemia: Secondary | ICD-10-CM

## 2022-09-03 MED ORDER — LEVOTHYROXINE SODIUM 75 MCG PO TABS: 75.0000 ug | ORAL_TABLET | Freq: Every day | ORAL | 1 refills | Status: DC

## 2022-09-03 NOTE — Progress Notes (Signed)
09/03/2022, 7:28 PM  Endocrinology follow-up note   Subjective:    Patient ID: Edwin Perez, male    DOB: 01/15/1951, PCP P[facility], [facility]   Past Medical History:  Diagnosis Date   BPH (benign prostatic hyperplasia)    Essential hypertension, benign 02/25/2019   HLD (hyperlipidemia) 02/25/2019   Hypertension    Hypothyroidism, adult 02/25/2019   Testicular failure 02/25/2019   Vitamin D deficiency disease 02/25/2019   Past Surgical History:  Procedure Laterality Date   HAND TENDON SURGERY     TONSILLECTOMY     Social History   Socioeconomic History   Marital status: Married    Spouse name: Not on file   Number of children: Not on file   Years of education: Not on file   Highest education level: Not on file  Occupational History   Not on file  Tobacco Use   Smoking status: Former    Packs/day: 1.00    Years: 45.00    Additional pack years: 0.00    Total pack years: 45.00    Types: Cigarettes    Start date: 09/15/1970    Quit date: 12/09/2015    Years since quitting: 6.7   Smokeless tobacco: Current    Types: Snuff   Tobacco comments:    has been dipping snuff for 30 years  Substance and Sexual Activity   Alcohol use: Not Currently   Drug use: No   Sexual activity: Not on file  Other Topics Concern   Not on file  Social History Narrative   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   Social Determinants of Health   Financial Resource Strain: Not on file  Food Insecurity: Not on file  Transportation Needs: Not on file  Physical Activity: Not on file  Stress: Not on file  Social Connections: Not on file   History reviewed. No pertinent family history. Outpatient Encounter Medications as of 09/03/2022  Medication Sig   albuterol (VENTOLIN HFA) 108 (90 Base) MCG/ACT inhaler Inhale 2 puffs into the lungs every 6 (six) hours as needed for wheezing or shortness of breath.  (Patient not taking: Reported on 03/05/2022)   Cholecalciferol (VITAMIN D3 PO) Take 1 tablet by mouth daily.   escitalopram (LEXAPRO) 20 MG tablet Take 20 mg by mouth daily.   fluticasone (FLONASE) 50 MCG/ACT nasal spray Place 1 spray into both nostrils 2 (two) times daily.   levothyroxine (SYNTHROID) 75 MCG tablet Take 1 tablet (75 mcg total) by mouth daily before breakfast.   Multiple Vitamins-Minerals (MULTIVITAMIN ADULTS PO) Take 1 tablet by mouth daily.   Omega-3 Fatty Acids (FISH OIL) 1000 MG CPDR Take 1,000 mg by mouth daily.   promethazine-dextromethorphan (PROMETHAZINE-DM) 6.25-15 MG/5ML syrup Take 5 mLs by mouth 4 (four) times daily as needed.   tadalafil (CIALIS) 5 MG tablet Take 1 tablet (5 mg total) by mouth as needed for erectile dysfunction.   [DISCONTINUED] levothyroxine (SYNTHROID) 88 MCG tablet TAKE 1 TABLET DAILY BEFORE BREAKFAST   No facility-administered encounter medications on file as of 09/03/2022.   ALLERGIES: No Known Allergies  VACCINATION STATUS: Immunization History  Administered Date(s) Administered   Fluad Quad(high Dose 65+) 03/11/2019, 03/11/2019, 03/16/2020   PFIZER(Purple Top)SARS-COV-2  Vaccination 07/23/2019, 08/15/2019    HPI Edwin Perez is 72 y.o. male who presents today with a medical history as above. he is being seen in follow-up after he was seen in consultation for hypothyroidism and hypogonadism requested by Pllc, [facility].  He was found to have iatrogenic hypogonadism during his prior visits at which time he was advised to stay off of testosterone supplements.  He returns with stable total testosterone 531 without any testosterone supplements. -See notes from prior visit.  He reports that he was diagnosed with hypothyroidism at approximate age of 39.  He was treated with various dose of thyroid hormone.  He was switched to controlled 88 mcg p.o. daily before breakfast.    He denies dysphagia, shortness of breath, nor voice change.      He denies prior history of thyroid surgery nor thyroid ablation.  He denies family history of thyroid dysfunction.  His recent labs show TSH within normal range.  He reports diffuse body aches, heat intolerance.  He denies dysphagia, shortness of breath, nor voice change.  His recent thyroid ultrasound is unremarkable.  He has no discrete nodules, thyroid is average in size.    He did have previous polycythemia with hematocrit of 57, hemoglobin 19.1. 1 other prior measurement showed 1464 total testosterone. He has prior use of bodybuilding androgens/protein preparations.  He denies any prolonged history of use of opioids.  He denies testicular injury, chemotherapy, radiation, nor head injury.  He has 2 grown kids. He notes that he did not know he had supraphysiologic levels of testosterone.  He has medical history of benign prostatic hyperplasia. He uses Cialis for ED.  He is on vitamin D for vitamin D deficiency. He has chronically uncontrolled hyperlipidemia current LDL at 130.  Review of Systems  Constitutional: +Steady weight since last visit, + fatigue, no subjective hyperthermia, no subjective hypothermia   Objective:       09/03/2022    3:39 PM 09/01/2022    8:15 AM 03/05/2022    3:54 PM  Vitals with BMI  Height 5\' 9"   5\' 9"   Weight 199 lbs 3 oz  204 lbs  BMI [id number]  [id number]  Systolic [id number] [id number]  Diastolic 76 74 72  Pulse 68 89 60    BP 118/76   Pulse 68   Ht 5\' 9"  (1.753 m)   Wt 199 lb 3.2 oz (90.4 kg)   BMI 29.42 kg/m   Wt Readings from Last 3 Encounters:  09/03/22 199 lb 3.2 oz (90.4 kg)  03/05/22 204 lb (92.5 kg)  10/30/21 194 lb 6.4 oz (88.2 kg)    Physical Exam  Constitutional:  Body mass index is 29.42 kg/m.,  not in acute distress, normal state of mind Eyes: PERRLA, EOMI, no exophthalmos  Genital exam: Testicles are shrunk bilaterally to 15 cc.  No scrotal mass, no hernia.   CMP ( most recent) CMP     Component Value Date/Time   NA 137 09/27/2020  1721   K 4.0 09/27/2020 1721   CL 102 09/27/2020 1721   CO2 27 09/27/2020 1721   GLUCOSE 81 09/27/2020 1721   BUN 15 09/27/2020 1721   CREATININE 1.56 (H) 09/27/2020 1721   CALCIUM 9.5 09/27/2020 1721   PROT 6.7 09/07/2020 1144   AST 23 09/07/2020 1144   ALT 17 09/07/2020 1144   BILITOT 0.6 09/07/2020 1144   GFRNONAA 40 (L) 09/07/2020 1144   GFRAA 46 (L) 09/07/2020 1144  Lipid Panel ( most recent) Lipid Panel     Component Value Date/Time   CHOL 198 08/23/2022 0831   TRIG 98 08/23/2022 0831   HDL 50 08/23/2022 0831   CHOLHDL 4.0 08/23/2022 0831   CHOLHDL 4.2 09/07/2020 1144   LDLCALC 130 (H) 08/23/2022 0831   LDLCALC 118 (H) 09/07/2020 1144   LABVLDL 18 08/23/2022 0831       Total testosterone: 1108, 1464   CBC    Component Value Date/Time   WBC 5.5 10/24/2021 0813   WBC 7.6 09/07/2020 1144   RBC 5.03 10/24/2021 0813   RBC 6.08 (H) 09/07/2020 1144   HGB 15.3 10/24/2021 0813   HCT 44.1 10/24/2021 0813   PLT 215 10/24/2021 0813   MCV 88 10/24/2021 0813   MCH 30.4 10/24/2021 0813   MCH 29.4 09/07/2020 1144   MCHC 34.7 10/24/2021 0813   MCHC 33.4 09/07/2020 1144   RDW 13.1 10/24/2021 0813   LYMPHSABS 1.8 10/24/2021 0813   EOSABS 0.3 10/24/2021 0813   BASOSABS 0.1 10/24/2021 0813   PSA was 2.9 on September 07, 2020.  Recent Results (from the past 2160 hour(s))  TSH     Status: Abnormal   Collection Time: 08/23/22  8:31 AM  Result Value Ref Range   TSH 0.172 (L) 0.450 - 4.500 uIU/mL  T4, free     Status: None   Collection Time: 08/23/22  8:31 AM  Result Value Ref Range   Free T4 1.51 0.82 - 1.77 ng/dL  Testosterone, Free, Total, SHBG     Status: Abnormal   Collection Time: 08/23/22  8:31 AM  Result Value Ref Range   Testosterone 531 264 - 916 ng/dL    Comment: Adult male reference interval is based on a population of healthy nonobese males (BMI <30) between 19 and 15 years old. [place], [place] [phone]. PMID: [id number].     Testosterone, Free 5.8 (L) 6.6 - 18.1 pg/mL   Sex Hormone Binding 82.7 (H) 19.3 - 76.4 nmol/L  Lipid panel     Status: Abnormal   Collection Time: 08/23/22  8:31 AM  Result Value Ref Range   Cholesterol, Total 198 100 - 199 mg/dL   Triglycerides 98 0 - 149 mg/dL   HDL 50 >39 mg/dL   VLDL Cholesterol Cal 18 5 - 40 mg/dL   LDL Chol Calc (NIH) 130 (H) 0 - 99 mg/dL   Chol/HDL Ratio 4.0 0.0 - 5.0 ratio    Comment:                                   T. Chol/HDL Ratio                                             Men  Women                               1/2 Avg.Risk  3.4    3.3                                   Avg.Risk  5.0    4.4  2X Avg.Risk  9.6    7.1                                3X Avg.Risk 23.4   11.0      Assessment & Plan:   1. Hypothyroidism, unspecified type 2.  Hypogonadism 3.  Hyperlipidemia 4.  Vitamin D deficiency  I discussed his recent labs.  His total testosterone is favorable at  531. He will not need testosterone supplement at this time.  The circumstances of his diagnosis with hypogonadism are not available to review.  In any case, he was found with iatrogenic hypergonadism with erythrocytosis at first consult.    I discussed age correlated testosterone target with him.  As a 72 year old man his average testosterone will be between 200-350.Will be considered for reinitiation of testosterone at a low dose only if he drops total testosterone below 250 ng per DL. He has risk factors including prior use of steroids/androgens for bodybuilding, will need continuous follow-up on his testosterone.   -Regarding his hypothyroidism: He was switched to levothyroxine 88 mcg daily before breakfast from high-dose NP thyroid during his last visit.   His previsit thyroid function tests are consistent with slight over replacement. I discussed and lowered his levothyroxine to 75 mcg po daily before breakfast.  - We discussed about the correct intake of his  thyroid hormone, on empty stomach at fasting, with water, separated by at least 30 minutes from breakfast and other medications,  and separated by more than 4 hours from calcium, iron, multivitamins, acid reflux medications (PPIs). -Patient is made aware of the fact that thyroid hormone replacement is needed for life, dose to be adjusted by periodic monitoring of thyroid function tests.   -His prior CBC measurements also showed polycythemia likely correlated to supraphysiologic testosterone levels.    He is advised to continue vitamin D 50,000 units weekly. -Regarding his dyslipidemia, he is only on omega-3 fatty acids.  He will likely need statin intervention,  he wishes to avoid meds. WFPB diet was discussed again. We will recheck in 6 months.   - he is advised to maintain close follow up with Pllc, [facility] for primary care needs.   I spent  26 minutes in the care of the patient today including review of labs from Thyroid Function, CMP, and other relevant labs ; imaging/biopsy records (current and previous including abstractions from other facilities); face-to-face time discussing  his lab results and symptoms, medications doses, his options of short and long term treatment based on the latest standards of care / guidelines;   and documenting the encounter.  Edwin Perez  participated in the discussions, expressed understanding, and voiced agreement with the above plans.  All questions were answered to his satisfaction. he is encouraged to contact clinic should he have any questions or concerns prior to his return visit.   Follow up plan: Return in about 6 months (around 03/06/2023) for Fasting Labs  in AM B4 8.   Glade Lloyd, MD [facility] [address] [place], [place] [address] Phone: [phone]  Fax: [phone]     09/03/2022, 7:28 PM  This note was partially dictated with voice recognition software. Similar  sounding words can be transcribed inadequately or may not  be corrected upon review.

## 2022-09-04 ENCOUNTER — Ambulatory Visit: Payer: Medicare Other | Admitting: Nurse Practitioner

## 2022-11-10 LAB — TESTOSTERONE, FREE, TOTAL, SHBG
Sex Hormone Binding: 69.9 nmol/L (ref 19.3–76.4)
Testosterone, Free: 7.3 pg/mL (ref 6.6–18.1)
Testosterone: 543 ng/dL (ref 264–916)

## 2022-11-10 LAB — LIPID PANEL
Chol/HDL Ratio: 4.3 ratio (ref 0.0–5.0)
Cholesterol, Total: 225 mg/dL — ABNORMAL HIGH (ref 100–199)
HDL: 52 mg/dL (ref >39)
LDL Chol Calc (NIH): 152 mg/dL — ABNORMAL HIGH (ref 0–99)
Triglycerides: 118 mg/dL (ref 0–149)
VLDL Cholesterol Cal: 21 mg/dL (ref 5–40)

## 2022-11-10 LAB — TSH: TSH: 0.964 u[IU]/mL (ref 0.450–4.500)

## 2022-11-10 LAB — T4, FREE: Free T4: 1.17 ng/dL (ref 0.82–1.77)

## 2022-11-18 LAB — LIPID PANEL
Cholesterol: 205 — AB (ref 0–200)
HDL: 52 (ref 35–70)
LDL Cholesterol: 132
Triglycerides: 115 (ref 40–160)

## 2022-11-18 LAB — BASIC METABOLIC PANEL
BUN: 20 (ref 4–21)
Creatinine: 1.2 (ref 0.6–1.3)
EGFR: 65

## 2022-11-18 LAB — CBC AND DIFFERENTIAL
HCT: 46 (ref 41–53)
Hemoglobin: 15.6 (ref 13.5–17.5)

## 2022-11-18 LAB — TSH: TSH: 1.19 (ref 0.41–5.90)

## 2022-11-18 LAB — TESTOSTERONE: Testosterone: 471

## 2022-12-09 ENCOUNTER — Other Ambulatory Visit: Payer: Self-pay

## 2022-12-09 DIAGNOSIS — E039 Hypothyroidism, unspecified: Secondary | ICD-10-CM

## 2022-12-09 MED ORDER — LEVOTHYROXINE SODIUM 75 MCG PO TABS: 75.0000 ug | ORAL_TABLET | Freq: Every day | ORAL | 0 refills | Status: DC

## 2023-03-06 ENCOUNTER — Encounter: Payer: Self-pay | Admitting: "Endocrinology

## 2023-03-06 ENCOUNTER — Ambulatory Visit (INDEPENDENT_AMBULATORY_CARE_PROVIDER_SITE_OTHER): Payer: Medicare Other | Admitting: "Endocrinology

## 2023-03-06 VITALS — BP 126/72 | HR 56 | Ht 69.0 in | Wt 201.0 lb

## 2023-03-06 DIAGNOSIS — E559 Vitamin D deficiency, unspecified: Secondary | ICD-10-CM

## 2023-03-06 DIAGNOSIS — E039 Hypothyroidism, unspecified: Secondary | ICD-10-CM

## 2023-03-06 DIAGNOSIS — E291 Testicular hypofunction: Secondary | ICD-10-CM | POA: Diagnosis not present

## 2023-03-06 DIAGNOSIS — E782 Mixed hyperlipidemia: Secondary | ICD-10-CM | POA: Diagnosis not present

## 2023-03-06 MED ORDER — ROSUVASTATIN CALCIUM 5 MG PO TABS: 5.0000 mg | ORAL_TABLET | Freq: Every evening | ORAL | 3 refills | Status: DC

## 2023-03-06 NOTE — Patient Instructions (Signed)

## 2023-03-06 NOTE — Progress Notes (Signed)
03/06/2023, 7:04 PM  Endocrinology follow-up note   Subjective:    Patient ID: Edwin Perez, male    DOB: 1950-12-12, PCP Pllc, [facility]   Past Medical History:  Diagnosis Date   BPH (benign prostatic hyperplasia)    Essential hypertension, benign 02/25/2019   HLD (hyperlipidemia) 02/25/2019   Hypertension    Hypothyroidism, adult 02/25/2019   Testicular failure 02/25/2019   Vitamin D deficiency disease 02/25/2019   Past Surgical History:  Procedure Laterality Date   HAND TENDON SURGERY     TONSILLECTOMY     Social History   Socioeconomic History   Marital status: Married    Spouse name: Not on file   Number of children: Not on file   Years of education: Not on file   Highest education level: Not on file  Occupational History   Not on file  Tobacco Use   Smoking status: Former    Current packs/day: 0.00    Average packs/day: 1 pack/day for 45.2 years (45.2 ttl pk-yrs)    Types: Cigarettes    Start date: 09/15/1970    Quit date: 12/09/2015    Years since quitting: 7.2   Smokeless tobacco: Current    Types: Snuff   Tobacco comments:    has been dipping snuff for 30 years  Substance and Sexual Activity   Alcohol use: Not Currently   Drug use: No   Sexual activity: Not on file  Other Topics Concern   Not on file  Social History Narrative   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   Social Determinants of Health   Financial Resource Strain: Not on file  Food Insecurity: Not on file  Transportation Needs: Not on file  Physical Activity: Not on file  Stress: Not on file  Social Connections: Not on file   History reviewed. No pertinent family history. Outpatient Encounter Medications as of 03/06/2023  Medication Sig   rosuvastatin (CRESTOR) 5 MG tablet Take 1 tablet (5 mg total) by mouth at bedtime.   albuterol (VENTOLIN HFA) 108 (90 Base) MCG/ACT inhaler Inhale 2 puffs  into the lungs every 6 (six) hours as needed for wheezing or shortness of breath. (Patient not taking: Reported on 03/05/2022)   Cholecalciferol (VITAMIN D3 PO) Take 1 tablet by mouth daily. (Patient not taking: Reported on 03/06/2023)   escitalopram (LEXAPRO) 20 MG tablet Take 20 mg by mouth daily.   fluticasone (FLONASE) 50 MCG/ACT nasal spray Place 1 spray into both nostrils 2 (two) times daily.   levothyroxine (SYNTHROID) 75 MCG tablet Take 1 tablet (75 mcg total) by mouth daily before breakfast.   Multiple Vitamins-Minerals (MULTIVITAMIN ADULTS PO) Take 1 tablet by mouth daily. (Patient not taking: Reported on 03/06/2023)   Omega-3 Fatty Acids (FISH OIL) 1000 MG CPDR Take 1,000 mg by mouth daily. (Patient not taking: Reported on 03/06/2023)   promethazine-dextromethorphan (PROMETHAZINE-DM) 6.25-15 MG/5ML syrup Take 5 mLs by mouth 4 (four) times daily as needed.   tadalafil (CIALIS) 5 MG tablet Take 1 tablet (5 mg total) by mouth as needed for erectile dysfunction.   No facility-administered encounter medications on file as of 03/06/2023.   ALLERGIES: No Known Allergies  VACCINATION STATUS: Immunization History  Administered Date(s) Administered   Fluad Quad(high Dose 65+) 03/11/2019, 03/11/2019, 03/16/2020   PFIZER(Purple Top)SARS-COV-2 Vaccination 07/23/2019, 08/15/2019    HPI Edwin Perez is 72 y.o. male who presents today with a medical history as above. he is being seen in follow-up after he was seen in consultation for hypothyroidism and hypogonadism requested by Pllc, [facility].  He was found to have iatrogenic hypergonadism during his prior visits at which time he was advised to stay off of testosterone replacement/supplements.  He returns with stable total testosterone 471 more than a year without any testosterone supplements. -See notes from prior visit.  He reports that he was diagnosed with hypothyroidism at approximate age of 68.  He was treated with various dose of  thyroid hormone.  He was switched to 75 mcg p.o. daily before breakfast.   He presents with no new complaints today, except for occasional fatigue.   He denies dysphagia, shortness of breath, nor voice change.     He denies prior history of thyroid surgery nor thyroid ablation.  He denies family history of thyroid dysfunction.    He reports diffuse body aches, heat intolerance.  He denies dysphagia, shortness of breath, nor voice change.  His recent thyroid ultrasound is unremarkable.  He has no discrete nodules, thyroid is average in size.     ------------------------------------------------------------------ He did have previous polycythemia with hematocrit of 57, hemoglobin 19.1. 1 other prior measurement showed 1464 total testosterone. He has prior use of bodybuilding androgens/protein preparations.  He denies any prolonged history of use of opioids.  He denies testicular injury, chemotherapy, radiation, nor head injury.  He has 2 grown kids. He notes that he did not know he had supraphysiologic levels of testosterone.  He has medical history of benign prostatic hyperplasia. He uses Cialis for ED.  He is on vitamin D for vitamin D deficiency. He has chronically uncontrolled hyperlipidemia current LDL at 132.  Review of Systems  Constitutional: +Steady weight since last visit, + fatigue, no subjective hyperthermia, no subjective hypothermia   Objective:       03/06/2023    3:56 PM 09/03/2022    3:39 PM 09/01/2022    8:15 AM  Vitals with BMI  Height 5\' 9"  5\' 9"    Weight 201 lbs 199 lbs 3 oz   BMI 29.67 29.4   Systolic 126 118 161  Diastolic 72 76 74  Pulse 56 68 89    BP 126/72   Pulse (!) 56   Ht 5\' 9"  (1.753 m)   Wt 201 lb (91.2 kg)   BMI 29.68 kg/m   Wt Readings from Last 3 Encounters:  03/06/23 201 lb (91.2 kg)  09/03/22 199 lb 3.2 oz (90.4 kg)  03/05/22 204 lb (92.5 kg)    Physical Exam  Constitutional:  Body mass index is 29.68 kg/m.,  not in acute distress,  normal state of mind Eyes: PERRLA, EOMI, no exophthalmos  Genital exam: Testicles are shrunk bilaterally to 15 cc.  No scrotal mass, no hernia.   CMP ( most recent) CMP     Component Value Date/Time   NA 137 09/27/2020 1721   K 4.0 09/27/2020 1721   CL 102 09/27/2020 1721   CO2 27 09/27/2020 1721   GLUCOSE 81 09/27/2020 1721   BUN 20 11/18/2022 0000   CREATININE 1.2 11/18/2022 0000   CREATININE 1.56 (H) 09/27/2020 1721   CALCIUM 9.5 09/27/2020 1721   PROT 6.7 09/07/2020 1144   AST 23 09/07/2020 1144  ALT 17 09/07/2020 1144   BILITOT 0.6 09/07/2020 1144   GFRNONAA 40 (L) 09/07/2020 1144   GFRAA 46 (L) 09/07/2020 1144       Lipid Panel ( most recent) Lipid Panel     Component Value Date/Time   CHOL 205 (A) 11/18/2022 0000   CHOL 225 (H) 11/05/2022 0815   TRIG 115 11/18/2022 0000   HDL 52 11/18/2022 0000   HDL 52 11/05/2022 0815   CHOLHDL 4.3 11/05/2022 0815   CHOLHDL 4.2 09/07/2020 1144   LDLCALC 132 11/18/2022 0000   LDLCALC 152 (H) 11/05/2022 0815   LDLCALC 118 (H) 09/07/2020 1144   LABVLDL 21 11/05/2022 0815       Total testosterone: 1108, 1464   CBC    Component Value Date/Time   WBC 5.5 10/24/2021 0813   WBC 7.6 09/07/2020 1144   RBC 5.03 10/24/2021 0813   RBC 6.08 (H) 09/07/2020 1144   HGB 15.6 11/18/2022 0000   HGB 15.3 10/24/2021 0813   HCT 46 11/18/2022 0000   HCT 44.1 10/24/2021 0813   PLT 215 10/24/2021 0813   MCV 88 10/24/2021 0813   MCH 30.4 10/24/2021 0813   MCH 29.4 09/07/2020 1144   MCHC 34.7 10/24/2021 0813   MCHC 33.4 09/07/2020 1144   RDW 13.1 10/24/2021 0813   LYMPHSABS 1.8 10/24/2021 0813   EOSABS 0.3 10/24/2021 0813   BASOSABS 0.1 10/24/2021 0813   PSA was 2.9 on September 07, 2020.    Assessment & Plan:   1. Hypothyroidism, unspecified type 2.  Hypogonadism 3.  Hyperlipidemia 4.  Vitamin D deficiency  I discussed his recent labs.  His total testosterone is favorable at 471 more than a year without any testosterone  supplement/replacement. He will not need testosterone supplement at this time.  The circumstances of his diagnosis with hypogonadism are not available to review.  In any case, he was found with iatrogenic hypergonadism with erythrocytosis at first consult.    I discussed age correlated testosterone target with him.  As a 72 year old man his average testosterone will be between 200-350. He has risk factors including prior use of steroids/androgens for bodybuilding, will need continuous follow-up on his testosterone. He will be kept on expectant management without any initiation of testosterone for now.   -Regarding his hypothyroidism: His recent TSH is consistent with appropriate replacement.  He is advised to continue levothyroxine 75 mcg p.o. daily before breakfast.     - We discussed about the correct intake of his thyroid hormone, on empty stomach at fasting, with water, separated by at least 30 minutes from breakfast and other medications,  and separated by more than 4 hours from calcium, iron, multivitamins, acid reflux medications (PPIs). -Patient is made aware of the fact that thyroid hormone replacement is needed for life, dose to be adjusted by periodic monitoring of thyroid function tests.  He is current CBC is all on target recovering from his previous erythrocytosis related to supraphysiologic testosterone levels.  His chronic dyslipidemia is concerning, he was hesitant to go on statins.  Due to the fact that his LDL is ranging between 130-150, he was approached for low-dose statin and he accepts this time. I discussed and prescribed Crestor 5 mg p.o. nightly for him.  Side effects and precautions discussed with him.  He will continue on omega-3 fatty acids as well. He will continue to benefit from over-the-counter vitamin D supplement.   WFPB diet was discussed again. We will recheck in 6 months.   -  he is advised to maintain close follow up with Pllc, [facility] for  primary care needs.    I spent  26  minutes in the care of the patient today including review of labs from Thyroid Function, CMP, and other relevant labs ; imaging/biopsy records (current and previous including abstractions from other facilities); face-to-face time discussing  his lab results and symptoms, medications doses, his options of short and long term treatment based on the latest standards of care / guidelines;   and documenting the encounter.  Edwin Perez  participated in the discussions, expressed understanding, and voiced agreement with the above plans.  All questions were answered to his satisfaction. he is encouraged to contact clinic should he have any questions or concerns prior to his return visit.   Follow up plan: Return in about 6 months (around 09/03/2023) for Fasting Labs  in AM B4 8, A1c -NV.   Marquis Lunch, MD [facility] [address] [place], [place] [address] Phone: [phone]  Fax: [phone]     03/06/2023, 7:04 PM  This note was partially dictated with voice recognition software. Similar sounding words can be transcribed inadequately or may not  be corrected upon review.

## 2023-03-10 ENCOUNTER — Telehealth: Payer: Self-pay | Admitting: "Endocrinology

## 2023-03-10 DIAGNOSIS — E559 Vitamin D deficiency, unspecified: Secondary | ICD-10-CM

## 2023-03-10 DIAGNOSIS — E291 Testicular hypofunction: Secondary | ICD-10-CM

## 2023-03-10 DIAGNOSIS — E782 Mixed hyperlipidemia: Secondary | ICD-10-CM

## 2023-03-10 DIAGNOSIS — E039 Hypothyroidism, unspecified: Secondary | ICD-10-CM

## 2023-03-10 NOTE — Telephone Encounter (Signed)
Please place new referral for [facility]

## 2023-03-11 NOTE — Telephone Encounter (Signed)
New referral in chart.

## 2023-03-24 ENCOUNTER — Encounter: Payer: Self-pay | Admitting: Nutrition

## 2023-03-24 ENCOUNTER — Encounter: Payer: Medicare Other | Attending: Adult Health | Admitting: Nutrition

## 2023-03-24 DIAGNOSIS — E782 Mixed hyperlipidemia: Secondary | ICD-10-CM | POA: Insufficient documentation

## 2023-03-24 NOTE — Progress Notes (Unsigned)
Medical Nutrition Therapy  Appointment Start time:  0830  Appointment End time:  0930  Primary concerns today: Hyperlipidemia  Referral diagnosis: E78 Preferred learning style: No Preference  Learning readiness: Ready    NUTRITION ASSESSMENT  72 yr old wmale referred for Hyperlipidemia from Dr. Fransico Him. PMH: Hypothyroidism, Hyperlipidemia, Hypogonadism, HTN, Vit D Deficiency,  He is willing to work on Lifestyle Medicine and work on more whole plant based foods and the 6 pillars of health to reverse his hyperlipidemia, HTN and other chronic medical issues.  Clinical Medical Hx:  Past Medical History:  Diagnosis Date   BPH (benign prostatic hyperplasia)    Essential hypertension, benign 02/25/2019   HLD (hyperlipidemia) 02/25/2019   Hypertension    Hypothyroidism, adult 02/25/2019   Testicular failure 02/25/2019   Vitamin D deficiency disease 02/25/2019    Medications:  Current Outpatient Medications on File Prior to Visit  Medication Sig Dispense Refill   albuterol (VENTOLIN HFA) 108 (90 Base) MCG/ACT inhaler Inhale 2 puffs into the lungs every 6 (six) hours as needed for wheezing or shortness of breath. (Patient not taking: Reported on 03/05/2022) 8 g 2   Cholecalciferol (VITAMIN D3 PO) Take 1 tablet by mouth daily. (Patient not taking: Reported on 03/06/2023)     escitalopram (LEXAPRO) 20 MG tablet Take 20 mg by mouth daily.     fluticasone (FLONASE) 50 MCG/ACT nasal spray Place 1 spray into both nostrils 2 (two) times daily. 16 g 2   levothyroxine (SYNTHROID) 75 MCG tablet Take 1 tablet (75 mcg total) by mouth daily before breakfast. 90 tablet 0   Multiple Vitamins-Minerals (MULTIVITAMIN ADULTS PO) Take 1 tablet by mouth daily. (Patient not taking: Reported on 03/06/2023)     Omega-3 Fatty Acids (FISH OIL) 1000 MG CPDR Take 1,000 mg by mouth daily. (Patient not taking: Reported on 03/06/2023)     promethazine-dextromethorphan (PROMETHAZINE-DM) 6.25-15 MG/5ML syrup Take 5 mLs by mouth 4  (four) times daily as needed. 100 mL 0   rosuvastatin (CRESTOR) 5 MG tablet Take 1 tablet (5 mg total) by mouth at bedtime. 90 tablet 3   tadalafil (CIALIS) 5 MG tablet Take 1 tablet (5 mg total) by mouth as needed for erectile dysfunction. 10 tablet 2   No current facility-administered medications on file prior to visit.    Labs: No results found for: "HGBA1C"    Latest Ref Rng & Units 11/18/2022   12:00 AM 09/27/2020    5:21 PM 09/07/2020   11:44 AM  CMP  Glucose 65 - 139 mg/dL  81  99   BUN 4 - 21 20     15  12    Creatinine 0.6 - 1.3 1.2     1.56  1.74   Sodium 135 - 146 mmol/L  137  138   Potassium 3.5 - 5.3 mmol/L  4.0  4.9   Chloride 98 - 110 mmol/L  102  102   CO2 20 - 32 mmol/L  27  27   Calcium 8.6 - 10.3 mg/dL  9.5  16.1   Total Protein 6.1 - 8.1 g/dL   6.7   Total Bilirubin 0.2 - 1.2 mg/dL   0.6   AST 10 - 35 U/L   23   ALT 9 - 46 U/L   17      This result is from an external source.   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  As evidenced by LDL 152 mg/dl.   NUTRITION INTERVENTION  Nutrition  education (E-1) on the following topics:  Nutrition and Diabetes education provided on My Plate, CHO counting, meal planning, portion sizes, timing of meals, avoiding snacks between meals unless having a low blood sugar, target ranges for A1C and blood sugars, signs/symptoms and treatment of hyper/hypoglycemia, monitoring blood sugars, taking medications as prescribed, benefits of exercising 30 minutes per day and prevention of complications of DM.  Low Saturated fat diet by avoiding animal products.   Handouts Provided Include  LIfestyle Medicine Handouts  Learning Style & Readiness for Change Teaching method utilized: Visual & Auditory  Demonstrated degree of understanding via: Teach Back  Barriers to learning/adherence to lifestyle change: None  Goals Established by Pt Eat three meals per day and 1-2 snacks per day as needed Focus food choices from whole plant based foods Get Mediterrian Diet cook books or whole plant based cookbooks to get recipe ideas Avoid animal fats and only choose chicken, fish or [place] as animal protein. Increase dried beans, peas, lentils and nuts/seeds for desired protein.  Download MyFitness Pal and log foods in and follow macros if desired.  MONITORING & EVALUATION Dietary intake, weekly physical activity,  in 1 month.  Next Steps  Patient is to work on meal planning and meal prepping. Marland Kitchen

## 2023-03-26 ENCOUNTER — Encounter: Payer: Self-pay | Admitting: Nutrition

## 2023-03-26 NOTE — Patient Instructions (Signed)
Goals Established by Pt Eat three meals per day and 1-2 snacks per day as needed Focus food choices from whole plant based foods Get Mediterranean Diet cook books or whole plant based cookbooks to get recipe ideas Avoid animal fats and only choose chicken, fish or [place] as animal protein. Increase dried beans, peas, lentils and nuts/seeds for desired protein.  Download MyFitness Pal and log foods in and follow macros if desired.

## 2023-04-24 ENCOUNTER — Ambulatory Visit: Payer: TRICARE For Life (TFL) | Admitting: Nutrition

## 2023-04-29 ENCOUNTER — Encounter: Payer: Medicare Other | Attending: Adult Health | Admitting: Nutrition

## 2023-04-29 VITALS — Ht 69.0 in | Wt 212.0 lb

## 2023-04-29 DIAGNOSIS — I10 Essential (primary) hypertension: Secondary | ICD-10-CM | POA: Insufficient documentation

## 2023-04-29 DIAGNOSIS — E782 Mixed hyperlipidemia: Secondary | ICD-10-CM | POA: Insufficient documentation

## 2023-04-29 NOTE — Progress Notes (Unsigned)
Medical Nutrition Therapy  Appointment Start time:  1630  Appointment End time:  1700  Primary concerns today: Hyperlipidemia  Referral diagnosis: E78 Preferred learning style: No Preference  Learning readiness: Ready    NUTRITION ASSESSMENT  72 yr old wmale referred for Hyperlipidemia from Dr. Fransico Him. PMH: Hypothyroidism, Hyperlipidemia, Hypogonadism, HTN, Vit D Deficiency, He has changed his diet a lot. Now eating more whole plant based foods and no real animal protein. He is following a high fiber whole plant based diet lifestyle. Works out 5 times a week with cardio and weight lifting. Is active outdoors. Likes training dogs. Feels a lot better.  No more alcohol. Doesn't smoke. Getting good sleep. Sees a therapist for PTSD and general mental health well being. Will see Dr. Fransico Him and get labs in the next few months. PCP [facility] and the [place].  His weight is up with healthier clothes on but he has been power lifting and gaining more muscle weight. Body type is very muscular.   Clinical  Wt Readings from Last 3 Encounters:  04/29/23 212 lb (96.2 kg)  03/06/23 201 lb (91.2 kg)  09/03/22 199 lb 3.2 oz (90.4 kg)   Ht Readings from Last 3 Encounters:  04/29/23 5\' 9"  (1.753 m)  03/06/23 5\' 9"  (1.753 m)  09/03/22 5\' 9"  (1.753 m)   Body mass index is 31.31 kg/m. @BMIFA @ Facility age limit for growth %iles is 20 years. Facility age limit for growth %iles is 20 years.  Medical Hx:  Past Medical History:  Diagnosis Date   BPH (benign prostatic hyperplasia)    Essential hypertension, benign 02/25/2019   HLD (hyperlipidemia) 02/25/2019   Hypertension    Hypothyroidism, adult 02/25/2019   Testicular failure 02/25/2019   Vitamin D deficiency disease 02/25/2019    Medications:  Current Outpatient Medications on File Prior to Visit  Medication Sig Dispense Refill   albuterol (VENTOLIN HFA) 108 (90 Base) MCG/ACT inhaler Inhale 2 puffs into the lungs every 6 (six) hours as needed  for wheezing or shortness of breath. (Patient not taking: Reported on 03/05/2022) 8 g 2   Cholecalciferol (VITAMIN D3 PO) Take 1 tablet by mouth daily. (Patient not taking: Reported on 03/06/2023)     escitalopram (LEXAPRO) 20 MG tablet Take 20 mg by mouth daily.     fluticasone (FLONASE) 50 MCG/ACT nasal spray Place 1 spray into both nostrils 2 (two) times daily. 16 g 2   levothyroxine (SYNTHROID) 75 MCG tablet Take 1 tablet (75 mcg total) by mouth daily before breakfast. 90 tablet 0   Multiple Vitamins-Minerals (MULTIVITAMIN ADULTS PO) Take 1 tablet by mouth daily. (Patient not taking: Reported on 03/06/2023)     Omega-3 Fatty Acids (FISH OIL) 1000 MG CPDR Take 1,000 mg by mouth daily. (Patient not taking: Reported on 03/06/2023)     promethazine-dextromethorphan (PROMETHAZINE-DM) 6.25-15 MG/5ML syrup Take 5 mLs by mouth 4 (four) times daily as needed. 100 mL 0   rosuvastatin (CRESTOR) 5 MG tablet Take 1 tablet (5 mg total) by mouth at bedtime. 90 tablet 3   tadalafil (CIALIS) 5 MG tablet Take 1 tablet (5 mg total) by mouth as needed for erectile dysfunction. 10 tablet 2   No current facility-administered medications on file prior to visit.    Labs: No results found for: "HGBA1C"    Latest Ref Rng & Units 11/18/2022   12:00 AM 09/27/2020    5:21 PM 09/07/2020   11:44 AM  CMP  Glucose 65 - 139 mg/dL  81  99   BUN 4 - 21 20     15  12    Creatinine 0.6 - 1.3 1.2  C    1.56  1.74   Sodium 135 - 146 mmol/L  137  138   Potassium 3.5 - 5.3 mmol/L  4.0  4.9   Chloride 98 - 110 mmol/L  102  102   CO2 20 - 32 mmol/L  27  27   Calcium 8.6 - 10.3 mg/dL  9.5  29.5   Total Protein 6.1 - 8.1 g/dL   6.7   Total Bilirubin 0.2 - 1.2 mg/dL   0.6   AST 10 - 35 U/L   23   ALT 9 - 46 U/L   17     C Corrected result   This result is from an external source.   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  As evidenced by LDL 152 mg/dl.   NUTRITION INTERVENTION  Nutrition education (E-1) on the following topics:  Nutrition and Diabetes education provided on My Plate, CHO counting, meal planning, portion sizes, timing of meals, avoiding snacks between meals unless having a low blood sugar, target ranges for A1C and blood sugars, signs/symptoms and treatment of hyper/hypoglycemia, monitoring blood sugars, taking medications as prescribed, benefits of exercising 30 minutes per day and prevention of complications of DM.  Low Saturated fat diet by avoiding animal products.   Handouts Provided Include  LIfestyle Medicine Handouts  Learning Style & Readiness for Change Teaching method utilized: Visual & Auditory  Demonstrated degree of understanding via: Teach Back  Barriers to learning/adherence to lifestyle change: None  Goals Established by Pt Keep up the great job!  MONITORING & EVALUATION Dietary intake, weekly physical activity,  in 1 month.  Next Steps  Patient is to work on meal planning and meal prepping. Marland Kitchen

## 2023-04-30 ENCOUNTER — Encounter: Payer: Self-pay | Admitting: Nutrition

## 2023-04-30 NOTE — Patient Instructions (Signed)
Keep up the great job  

## 2023-08-01 IMAGING — US US THYROID
1 series · 14 of 25 positions shown · non-contrast
Comparison: None.

CLINICAL DATA: Hypothyroidism

Goiter
EXAM:
THYROID ULTRASOUND
TECHNIQUE: Ultrasound examination of the thyroid gland and adjacent soft
tissues was performed.

[Series 1: us thyroid · 14 of 49 slices shown]
[im 1/49]
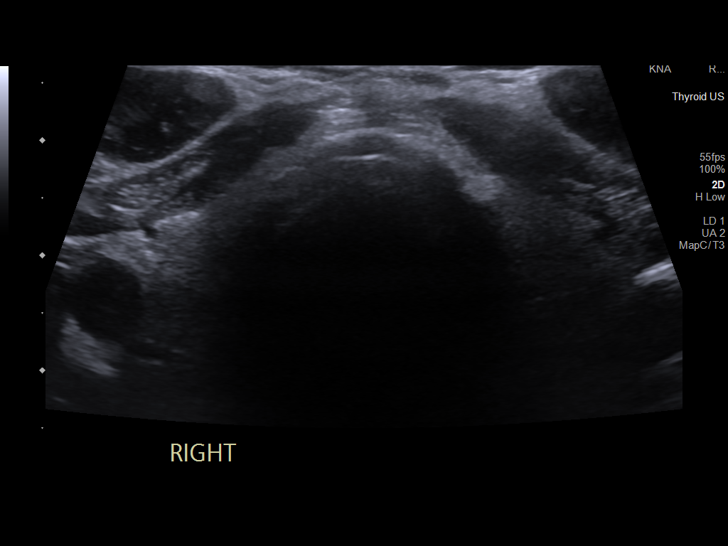
[im 5/49]
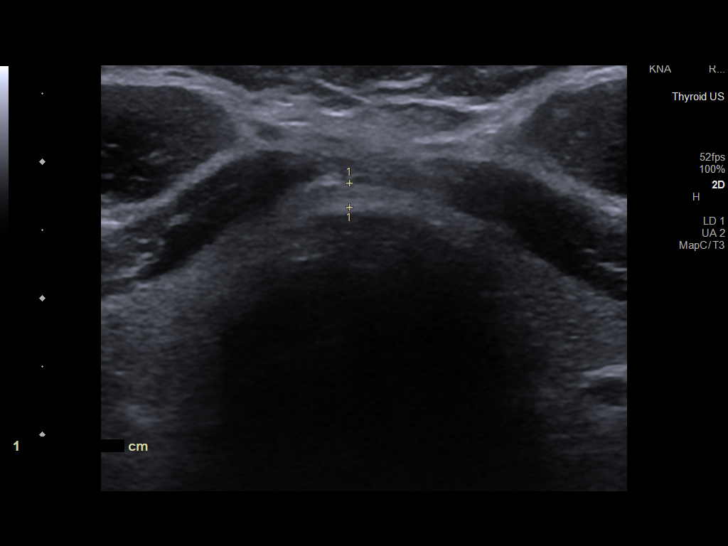
[im 9/49]
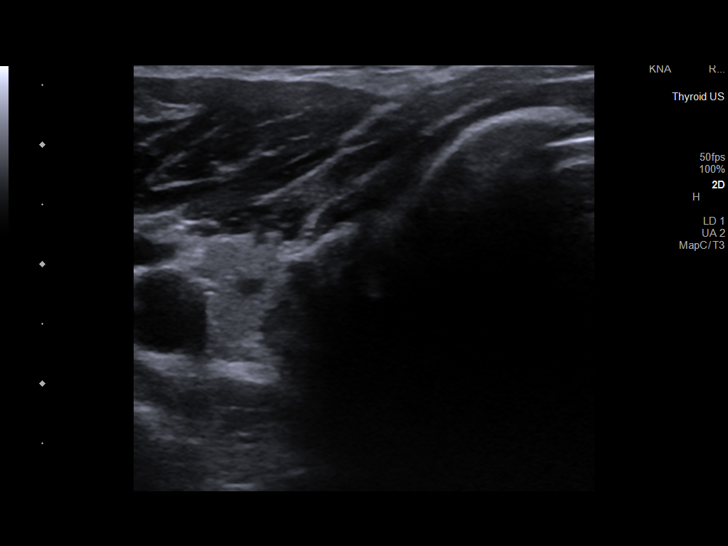
[im 13/49]
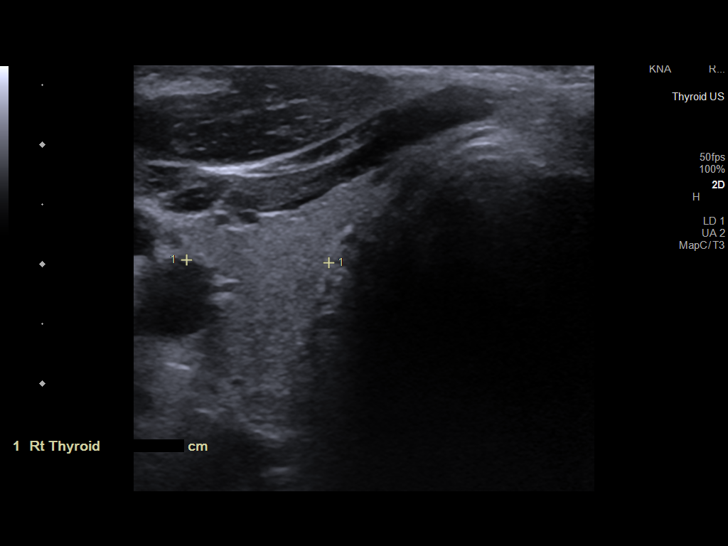
[im 17/49]
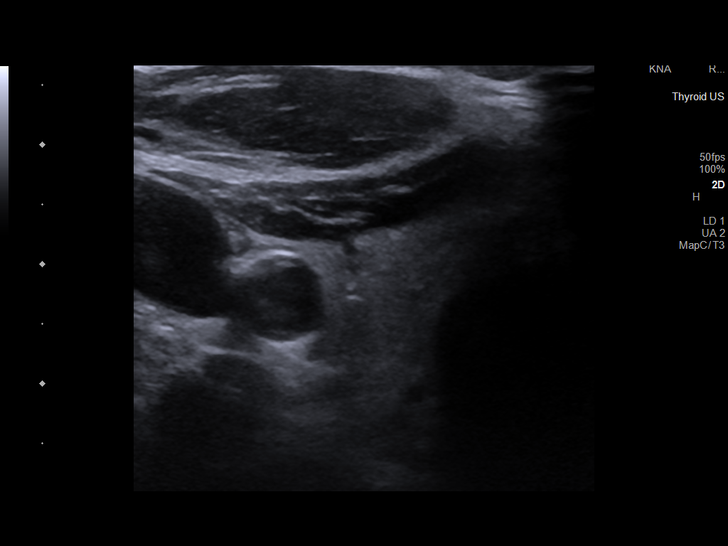
[im 19/49]
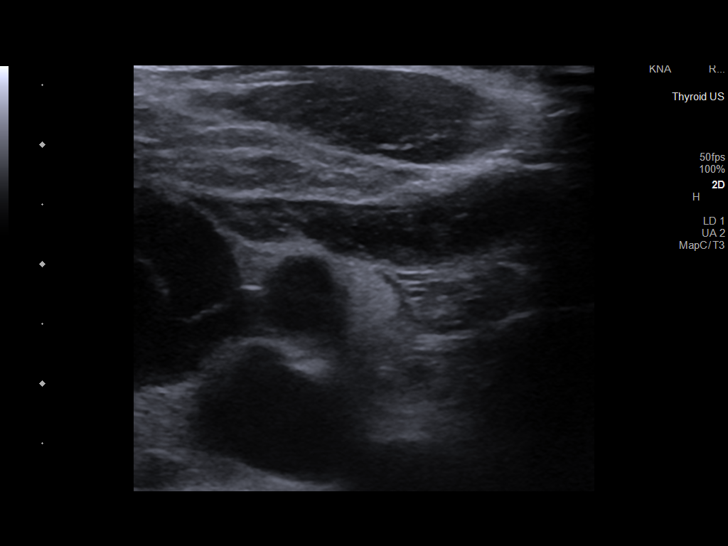
[im 23/49]
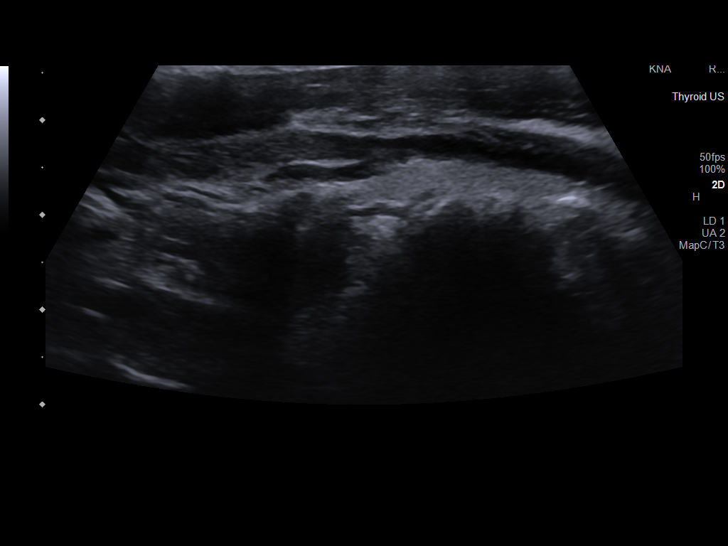
[im 27/49]
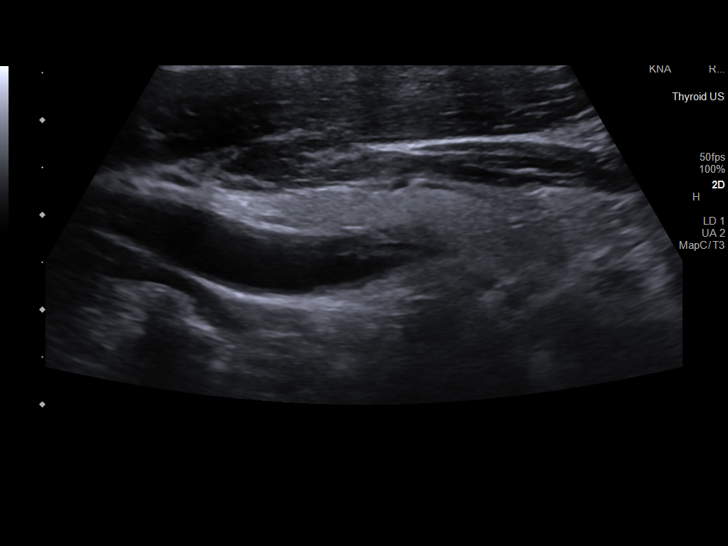
[im 31/49]
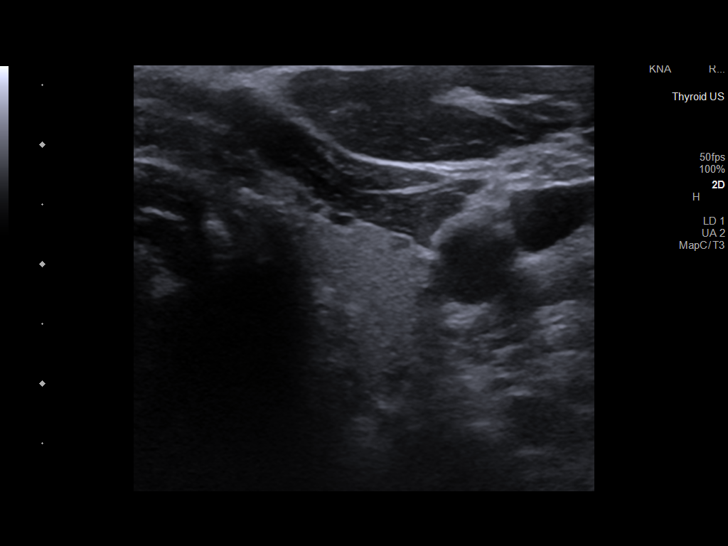
[im 33/49]
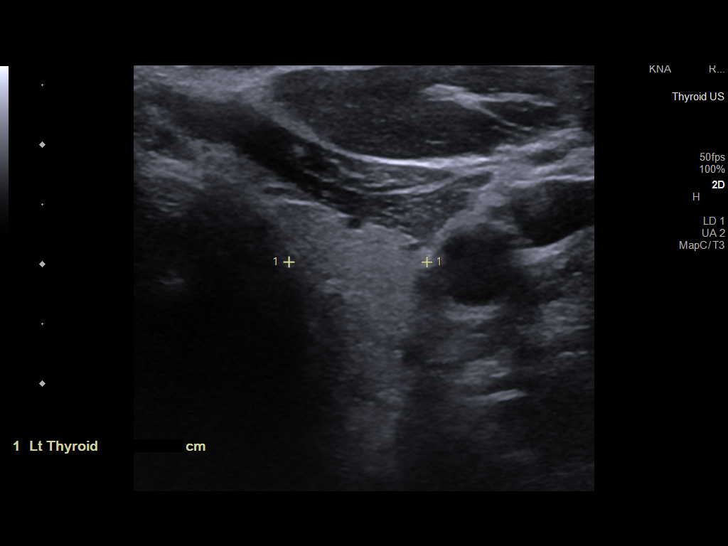
[im 37/49]
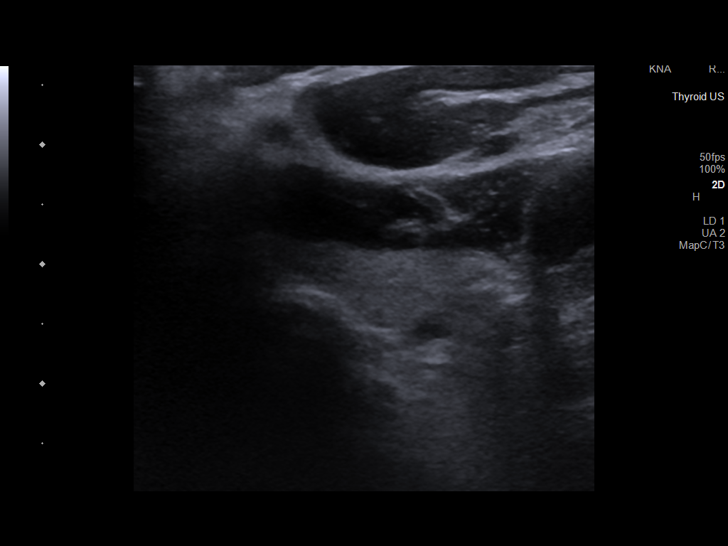
[im 41/49]
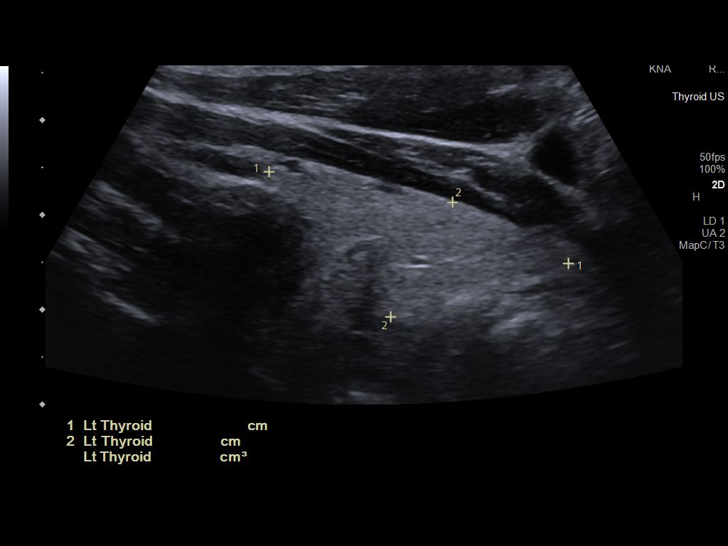
[im 45/49]
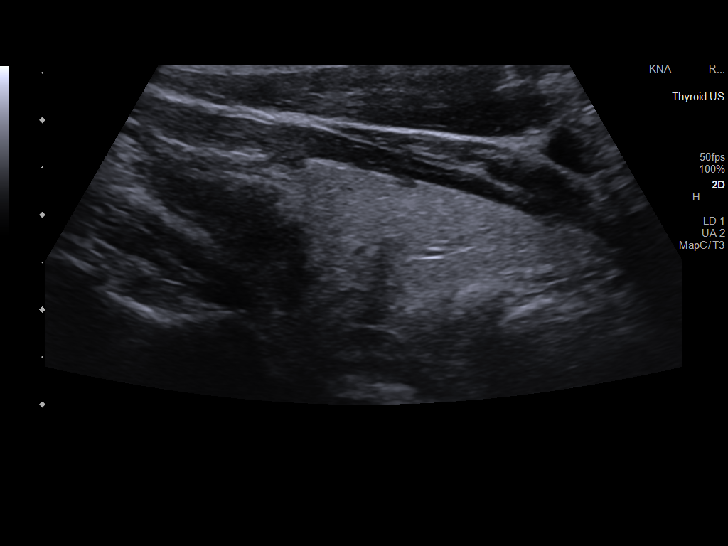
[im 49/49]
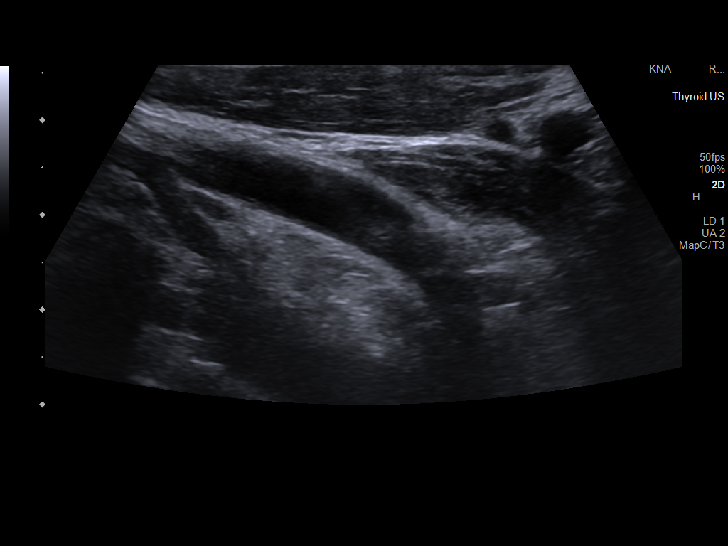

[14 of 25 positions shown; findings below may reference images not displayed]

FINDINGS: Parenchymal Echotexture: Normal

Isthmus: 0.2 cm

Right lobe: 4.9 x 1.6 x 1.2 cm

Left lobe: 3.3 x 1.4 x 1.2 cm

_________________________________________________________

Estimated total number of nodules >/= 1 cm: 0

Number of spongiform nodules >/=  2 cm not described below (TR1): 0

Number of mixed cystic and solid nodules >/= 1.5 cm not described
below (TR2): 0

_________________________________________________________

No discrete nodules are seen within the thyroid gland.
IMPRESSION: Normal thyroid ultrasound.

The above is in keeping with the ACR TI-RADS recommendations - [HOSPITAL] 7534;[DATE].

## 2023-09-01 LAB — LIPID PANEL
Chol/HDL Ratio: 2.8 ratio (ref 0.0–5.0)
Cholesterol, Total: 142 mg/dL (ref 100–199)
HDL: 50 mg/dL (ref >39)
LDL Chol Calc (NIH): 69 mg/dL (ref 0–99)
Triglycerides: 129 mg/dL (ref 0–149)
VLDL Cholesterol Cal: 23 mg/dL (ref 5–40)

## 2023-09-01 LAB — TESTOSTERONE, FREE, TOTAL, SHBG
Sex Hormone Binding: 51.3 nmol/L (ref 19.3–76.4)
Testosterone, Free: 5.4 pg/mL — ABNORMAL LOW (ref 6.6–18.1)
Testosterone: 375 ng/dL (ref 264–916)

## 2023-09-01 LAB — T4, FREE: Free T4: 1.32 ng/dL (ref 0.82–1.77)

## 2023-09-01 LAB — TSH: TSH: 1.76 u[IU]/mL (ref 0.450–4.500)

## 2023-09-01 LAB — CORTISOL-AM, BLOOD: Cortisol - AM: 12.7 ug/dL (ref 6.2–19.4)

## 2023-09-04 ENCOUNTER — Encounter: Payer: Self-pay | Admitting: "Endocrinology

## 2023-09-04 ENCOUNTER — Ambulatory Visit (INDEPENDENT_AMBULATORY_CARE_PROVIDER_SITE_OTHER): Payer: Medicare Other | Admitting: "Endocrinology

## 2023-09-04 VITALS — BP 106/64 | HR 68 | Ht 69.0 in | Wt 206.6 lb

## 2023-09-04 DIAGNOSIS — E782 Mixed hyperlipidemia: Secondary | ICD-10-CM

## 2023-09-04 DIAGNOSIS — E291 Testicular hypofunction: Secondary | ICD-10-CM | POA: Diagnosis not present

## 2023-09-04 DIAGNOSIS — E559 Vitamin D deficiency, unspecified: Secondary | ICD-10-CM | POA: Diagnosis not present

## 2023-09-04 DIAGNOSIS — E039 Hypothyroidism, unspecified: Secondary | ICD-10-CM

## 2023-09-04 MED ORDER — LEVOTHYROXINE SODIUM 75 MCG PO TABS: 75.0000 ug | ORAL_TABLET | Freq: Every day | ORAL | 1 refills | Status: AC

## 2023-09-04 MED ORDER — ROSUVASTATIN CALCIUM 5 MG PO TABS: 5.0000 mg | ORAL_TABLET | Freq: Every evening | ORAL | 3 refills | Status: DC

## 2023-09-04 NOTE — Progress Notes (Unsigned)
 09/04/2023, 6:48 PM  Endocrinology follow-up note   Subjective:    Patient ID: Edwin Perez, male    DOB: 18-Feb-1951, PCP Pllc, [facility]   Past Medical History:  Diagnosis Date   BPH (benign prostatic hyperplasia)    Essential hypertension, benign 02/25/2019   HLD (hyperlipidemia) 02/25/2019   Hypertension    Hypothyroidism, adult 02/25/2019   Testicular failure 02/25/2019   Vitamin D deficiency disease 02/25/2019   Past Surgical History:  Procedure Laterality Date   HAND TENDON SURGERY     TONSILLECTOMY     Social History   Socioeconomic History   Marital status: Married    Spouse name: Not on file   Number of children: Not on file   Years of education: Not on file   Highest education level: Not on file  Occupational History   Not on file  Tobacco Use   Smoking status: Former    Current packs/day: 0.00    Average packs/day: 1 pack/day for 45.2 years (45.2 ttl pk-yrs)    Types: Cigarettes    Start date: 09/15/1970    Quit date: 12/09/2015    Years since quitting: 7.7   Smokeless tobacco: Current    Types: Snuff   Tobacco comments:    has been dipping snuff for 30 years  Substance and Sexual Activity   Alcohol use: Not Currently   Drug use: No   Sexual activity: Not on file  Other Topics Concern   Not on file  Social History Narrative   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   Social Drivers of Corporate investment banker Strain: Not on file  Food Insecurity: Not on file  Transportation Needs: Not on file  Physical Activity: Not on file  Stress: Not on file  Social Connections: Not on file   History reviewed. No pertinent family history. Outpatient Encounter Medications as of 09/04/2023  Medication Sig   buPROPion (WELLBUTRIN) 75 MG tablet Take 75 mg by mouth daily.   Cholecalciferol (VITAMIN D3 PO) Take 1 tablet by mouth daily.   loratadine (CLARITIN) 10 MG  tablet Take 10 mg by mouth daily as needed.   Multiple Vitamins-Minerals (MULTIVITAMIN ADULTS PO) Take 1 tablet by mouth daily.   escitalopram (LEXAPRO) 20 MG tablet Take 20 mg by mouth daily. (Patient not taking: Reported on 09/04/2023)   fluticasone (FLONASE) 50 MCG/ACT nasal spray Place 1 spray into both nostrils 2 (two) times daily.   levothyroxine (SYNTHROID) 75 MCG tablet Take 1 tablet (75 mcg total) by mouth daily before breakfast.   Omega-3 Fatty Acids (FISH OIL) 1000 MG CPDR Take 1,000 mg by mouth daily. (Patient not taking: Reported on 03/06/2023)   rosuvastatin (CRESTOR) 5 MG tablet Take 1 tablet (5 mg total) by mouth at bedtime.   tadalafil (CIALIS) 5 MG tablet Take 1 tablet (5 mg total) by mouth as needed for erectile dysfunction.   [DISCONTINUED] albuterol (VENTOLIN HFA) 108 (90 Base) MCG/ACT inhaler Inhale 2 puffs into the lungs every 6 (six) hours as needed for wheezing or shortness of breath. (Patient not taking: Reported on 03/05/2022)   [DISCONTINUED] levothyroxine (SYNTHROID) 75 MCG tablet Take 1 tablet (75 mcg total) by mouth daily before  breakfast.   [DISCONTINUED] promethazine-dextromethorphan (PROMETHAZINE-DM) 6.25-15 MG/5ML syrup Take 5 mLs by mouth 4 (four) times daily as needed. (Patient not taking: Reported on 09/04/2023)   [DISCONTINUED] rosuvastatin (CRESTOR) 5 MG tablet Take 1 tablet (5 mg total) by mouth at bedtime.   No facility-administered encounter medications on file as of 09/04/2023.   ALLERGIES: No Known Allergies  VACCINATION STATUS: Immunization History  Administered Date(s) Administered   Fluad Quad(high Dose 65+) 03/11/2019, 03/11/2019, 03/16/2020   PFIZER(Purple Top)SARS-COV-2 Vaccination 07/23/2019, 08/15/2019    HPI Edwin Perez is 73 y.o. male who presents today with a medical history as above. he is being seen in follow-up after he was seen in consultation for hypothyroidism and hypogonadism requested by Pllc, [facility].  He was found to  have iatrogenic hypergonadism during his prior visits at which time he was advised to stay off of testosterone replacement/supplements.  He returns with stable total testosterone 471 more than a year without any testosterone supplements. -See notes from prior visit.  He reports that he was diagnosed with hypothyroidism at approximate age of 25.  He was treated with various dose of thyroid hormone.  He was switched to 75 mcg p.o. daily before breakfast.   He presents with no new complaints today, except for occasional fatigue.   He denies dysphagia, shortness of breath, nor voice change.     He denies prior history of thyroid surgery nor thyroid ablation.  He denies family history of thyroid dysfunction.    He reports diffuse body aches, heat intolerance.  He denies dysphagia, shortness of breath, nor voice change.  His recent thyroid ultrasound is unremarkable.  He has no discrete nodules, thyroid is average in size.     ------------------------------------------------------------------ He did have previous polycythemia with hematocrit of 57, hemoglobin 19.1. 1 other prior measurement showed 1464 total testosterone. He has prior use of bodybuilding androgens/protein preparations.  He denies any prolonged history of use of opioids.  He denies testicular injury, chemotherapy, radiation, nor head injury.  He has 2 grown kids. He notes that he did not know he had supraphysiologic levels of testosterone.  He has medical history of benign prostatic hyperplasia. He uses Cialis for ED.  He is on vitamin D for vitamin D deficiency. He has chronically uncontrolled hyperlipidemia current LDL at 132.  Review of Systems  Constitutional: +Steady weight since last visit, + fatigue, no subjective hyperthermia, no subjective hypothermia   Objective:       09/04/2023    3:55 PM 04/29/2023    4:35 PM 03/06/2023    3:56 PM  Vitals with BMI  Height 5\' 9"  5\' 9"  5\' 9"   Weight 206 lbs 10 oz 212 lbs 201 lbs   BMI 30.5 31.29 29.67  Systolic 106  126  Diastolic 64  72  Pulse 68  56    BP 106/64   Pulse 68   Ht 5\' 9"  (1.753 m)   Wt 206 lb 9.6 oz (93.7 kg)   BMI 30.51 kg/m   Wt Readings from Last 3 Encounters:  09/04/23 206 lb 9.6 oz (93.7 kg)  04/29/23 212 lb (96.2 kg)  03/06/23 201 lb (91.2 kg)    Physical Exam  Constitutional:  Body mass index is 30.51 kg/m.,  not in acute distress, normal state of mind Eyes: PERRLA, EOMI, no exophthalmos  Genital exam: Testicles are shrunk bilaterally to 15 cc.  No scrotal mass, no hernia.   CMP ( most recent) CMP     Component Value  Date/Time   NA 137 09/27/2020 1721   K 4.0 09/27/2020 1721   CL 102 09/27/2020 1721   CO2 27 09/27/2020 1721   GLUCOSE 81 09/27/2020 1721   BUN 20 11/18/2022 0000   CREATININE 1.2 11/18/2022 0000   CREATININE 1.56 (H) 09/27/2020 1721   CALCIUM 9.5 09/27/2020 1721   PROT 6.7 09/07/2020 1144   AST 23 09/07/2020 1144   ALT 17 09/07/2020 1144   BILITOT 0.6 09/07/2020 1144   GFRNONAA 40 (L) 09/07/2020 1144   GFRAA 46 (L) 09/07/2020 1144       Lipid Panel ( most recent) Lipid Panel     Component Value Date/Time   CHOL 142 08/26/2023 0908   TRIG 129 08/26/2023 0908   HDL 50 08/26/2023 0908   CHOLHDL 2.8 08/26/2023 0908   CHOLHDL 4.2 09/07/2020 1144   LDLCALC 69 08/26/2023 0908   LDLCALC 118 (H) 09/07/2020 1144   LABVLDL 23 08/26/2023 0908       Total testosterone: 1108, 1464   CBC    Component Value Date/Time   WBC 5.5 10/24/2021 0813   WBC 7.6 09/07/2020 1144   RBC 5.03 10/24/2021 0813   RBC 6.08 (H) 09/07/2020 1144   HGB 15.6 11/18/2022 0000   HGB 15.3 10/24/2021 0813   HCT 46 11/18/2022 0000   HCT 44.1 10/24/2021 0813   PLT 215 10/24/2021 0813   MCV 88 10/24/2021 0813   MCH 30.4 10/24/2021 0813   MCH 29.4 09/07/2020 1144   MCHC 34.7 10/24/2021 0813   MCHC 33.4 09/07/2020 1144   RDW 13.1 10/24/2021 0813   LYMPHSABS 1.8 10/24/2021 0813   EOSABS 0.3 10/24/2021 0813   BASOSABS  0.1 10/24/2021 0813   PSA was 2.9 on September 07, 2020.    Assessment & Plan:   1. Hypothyroidism, unspecified type 2.  Hypogonadism 3.  Hyperlipidemia 4.  Vitamin D deficiency  I discussed his recent labs.  His total testosterone is favorable at 471 more than a year without any testosterone supplement/replacement. He will not need testosterone supplement at this time.  The circumstances of his diagnosis with hypogonadism are not available to review.  In any case, he was found with iatrogenic hypergonadism with erythrocytosis at first consult.    I discussed age correlated testosterone target with him.  As a 73 year old man his average testosterone will be between 200-350. He has risk factors including prior use of steroids/androgens for bodybuilding, will need continuous follow-up on his testosterone. He will be kept on expectant management without any initiation of testosterone for now.   -Regarding his hypothyroidism: His recent TSH is consistent with appropriate replacement.  He is advised to continue levothyroxine 75 mcg p.o. daily before breakfast.     - We discussed about the correct intake of his thyroid hormone, on empty stomach at fasting, with water, separated by at least 30 minutes from breakfast and other medications,  and separated by more than 4 hours from calcium, iron, multivitamins, acid reflux medications (PPIs). -Patient is made aware of the fact that thyroid hormone replacement is needed for life, dose to be adjusted by periodic monitoring of thyroid function tests.  He is current CBC is all on target recovering from his previous erythrocytosis related to supraphysiologic testosterone levels.  His chronic dyslipidemia is concerning, he was hesitant to go on statins.  Due to the fact that his LDL is ranging between 130-150, he was approached for low-dose statin and he accepts this time. I discussed and prescribed Crestor 5  mg p.o. nightly for him.  Side effects and  precautions discussed with him.  He will continue on omega-3 fatty acids as well. He will continue to benefit from over-the-counter vitamin D supplement.   WFPB diet was discussed again. We will recheck in 6 months.   - he is advised to maintain close follow up with Pllc, [facility] for primary care needs.    I spent  26  minutes in the care of the patient today including review of labs from Thyroid Function, CMP, and other relevant labs ; imaging/biopsy records (current and previous including abstractions from other facilities); face-to-face time discussing  his lab results and symptoms, medications doses, his options of short and long term treatment based on the latest standards of care / guidelines;   and documenting the encounter.  Edwin Perez  participated in the discussions, expressed understanding, and voiced agreement with the above plans.  All questions were answered to his satisfaction. he is encouraged to contact clinic should he have any questions or concerns prior to his return visit.   Follow up plan: Return in about 6 months (around 03/06/2024) for Fasting Labs  in AM B4 8.   Marquis Lunch, MD [facility] [address] [place], [place] [address] Phone: [phone]  Fax: [phone]     09/04/2023, 6:48 PM  This note was partially dictated with voice recognition software. Similar sounding words can be transcribed inadequately or may not  be corrected upon review.

## 2023-09-09 ENCOUNTER — Telehealth: Payer: Self-pay

## 2023-09-09 NOTE — Telephone Encounter (Signed)
 Left a message requesting pt return call to the office.

## 2023-09-11 NOTE — Telephone Encounter (Signed)
 Spoke with pt, he requested we add Tamsulosin 0.4mg  to medication list. Tamsulosin 0.4mg  daily added to med list.

## 2023-09-11 NOTE — Telephone Encounter (Signed)
 Called pt back and said he was returning your call.

## 2023-09-11 NOTE — Addendum Note (Signed)
 Addended by: Derrell Lolling on: 09/11/2023 04:38 PM   Modules accepted: Orders

## 2024-03-05 LAB — COMPREHENSIVE METABOLIC PANEL WITH GFR
ALT: 26 IU/L (ref 0–44)
AST: 26 IU/L (ref 0–40)
Albumin: 4.2 g/dL (ref 3.8–4.8)
Alkaline Phosphatase: 74 IU/L (ref 47–123)
BUN/Creatinine Ratio: 5 — ABNORMAL LOW (ref 10–24)
BUN: 9 mg/dL (ref 8–27)
Bilirubin Total: 0.5 mg/dL (ref 0.0–1.2)
CO2: 22 mmol/L (ref 20–29)
Calcium: 9.4 mg/dL (ref 8.6–10.2)
Chloride: 104 mmol/L (ref 96–106)
Creatinine, Ser: 1.74 mg/dL — ABNORMAL HIGH (ref 0.76–1.27)
Globulin, Total: 2.1 g/dL (ref 1.5–4.5)
Glucose: 88 mg/dL (ref 70–99)
Potassium: 4.4 mmol/L (ref 3.5–5.2)
Sodium: 139 mmol/L (ref 134–144)
Total Protein: 6.3 g/dL (ref 6.0–8.5)
eGFR: 41 mL/min/1.73 — ABNORMAL LOW (ref >59)

## 2024-03-05 LAB — LIPID PANEL
Chol/HDL Ratio: 2.9 ratio (ref 0.0–5.0)
Cholesterol, Total: 131 mg/dL (ref 100–199)
HDL: 45 mg/dL (ref >39)
LDL Chol Calc (NIH): 66 mg/dL (ref 0–99)
Triglycerides: 112 mg/dL (ref 0–149)
VLDL Cholesterol Cal: 20 mg/dL (ref 5–40)

## 2024-03-05 LAB — TESTOSTERONE, FREE, TOTAL, SHBG
Sex Hormone Binding: 49.1 nmol/L (ref 19.3–76.4)
Testosterone, Free: 6.1 pg/mL — AB (ref 6.6–18.1)
Testosterone: 427 ng/dL (ref 264–916)

## 2024-03-05 LAB — T4, FREE: Free T4: 1.26 ng/dL (ref 0.82–1.77)

## 2024-03-05 LAB — TSH: TSH: 2.02 u[IU]/mL (ref 0.450–4.500)

## 2024-03-08 ENCOUNTER — Ambulatory Visit: Admitting: "Endocrinology

## 2024-03-08 ENCOUNTER — Encounter: Payer: Self-pay | Admitting: "Endocrinology

## 2024-03-08 VITALS — BP 116/66 | HR 64 | Ht 69.0 in | Wt 211.2 lb

## 2024-03-08 DIAGNOSIS — E039 Hypothyroidism, unspecified: Secondary | ICD-10-CM

## 2024-03-08 DIAGNOSIS — E291 Testicular hypofunction: Secondary | ICD-10-CM | POA: Diagnosis not present

## 2024-03-08 DIAGNOSIS — E559 Vitamin D deficiency, unspecified: Secondary | ICD-10-CM | POA: Diagnosis not present

## 2024-03-08 DIAGNOSIS — E782 Mixed hyperlipidemia: Secondary | ICD-10-CM

## 2024-03-08 MED ORDER — ROSUVASTATIN CALCIUM 5 MG PO TABS: 5.0000 mg | ORAL_TABLET | Freq: Every evening | ORAL | 1 refills | Status: AC

## 2024-03-08 NOTE — Progress Notes (Signed)
 03/08/2024, 12:45 PM  Endocrinology follow-up note   Subjective:    Patient ID: Edwin Perez, male    DOB: 12-20-1950, PCP Nsumanganyi, Edwin Elizabeth, NP   Past Medical History:  Diagnosis Date   BPH (benign prostatic hyperplasia)    Essential hypertension, benign 02/25/2019   HLD (hyperlipidemia) 02/25/2019   Hypertension    Hypothyroidism, adult 02/25/2019   Testicular failure 02/25/2019   Vitamin D  deficiency disease 02/25/2019   Past Surgical History:  Procedure Laterality Date   HAND TENDON SURGERY     TONSILLECTOMY     Social History   Socioeconomic History   Marital status: Married    Spouse name: Not on file   Number of children: Not on file   Years of education: Not on file   Highest education level: Not on file  Occupational History   Not on file  Tobacco Use   Smoking status: Former    Current packs/day: 0.00    Average packs/day: 1 pack/day for 45.2 years (45.2 ttl pk-yrs)    Types: Cigarettes    Start date: 09/15/1970    Quit date: 12/09/2015    Years since quitting: 8.2   Smokeless tobacco: Current    Types: Snuff   Tobacco comments:    has been dipping snuff for 30 years  Substance and Sexual Activity   Alcohol use: Not Currently   Drug use: No   Sexual activity: Not on file  Other Topics Concern   Not on file  Social History Narrative   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   Social Drivers of Corporate investment banker Strain: Not on file  Food Insecurity: Not on file  Transportation Needs: Not on file  Physical Activity: Not on file  Stress: Not on file  Social Connections: Not on file   History reviewed. No pertinent family history. Outpatient Encounter Medications as of 03/08/2024  Medication Sig   Cyanocobalamin (VITAMIN B-12 PO) Take 1 tablet by mouth daily.   buPROPion (WELLBUTRIN) 75 MG tablet Take 75 mg by mouth daily.   Cholecalciferol (VITAMIN  D3 PO) Take 1 tablet by mouth daily.   escitalopram (LEXAPRO) 20 MG tablet Take 20 mg by mouth daily. (Patient not taking: Reported on 09/04/2023)   fluticasone  (FLONASE ) 50 MCG/ACT nasal spray Place 1 spray into both nostrils 2 (two) times daily.   levothyroxine  (SYNTHROID ) 75 MCG tablet Take 1 tablet (75 mcg total) by mouth daily before breakfast.   loratadine (CLARITIN) 10 MG tablet Take 10 mg by mouth daily as needed.   Multiple Vitamins-Minerals (MULTIVITAMIN ADULTS PO) Take 1 tablet by mouth daily.   Omega-3 Fatty Acids (FISH OIL) 1000 MG CPDR Take 1,000 mg by mouth daily. (Patient not taking: Reported on 03/06/2023)   rosuvastatin  (CRESTOR ) 5 MG tablet Take 1 tablet (5 mg total) by mouth at bedtime.   tadalafil  (CIALIS ) 5 MG tablet Take 1 tablet (5 mg total) by mouth as needed for erectile dysfunction.   tamsulosin (FLOMAX) 0.4 MG CAPS capsule Take 0.4 mg by mouth daily. (Patient not taking: Reported on 03/08/2024)   [DISCONTINUED] rosuvastatin  (CRESTOR ) 5 MG tablet Take 1 tablet (5 mg total) by mouth at bedtime.   No  facility-administered encounter medications on file as of 03/08/2024.   ALLERGIES: No Known Allergies  VACCINATION STATUS: Immunization History  Administered Date(s) Administered   Fluad Quad(high Dose 65+) 03/11/2019, 03/11/2019, 03/16/2020   PFIZER(Purple Top)SARS-COV-2 Vaccination 07/23/2019, 08/15/2019    HPI [name] is 73 y.o. male who presents today with a medical history as above. he is being seen in follow-up after he was seen in consultation for hypothyroidism and hypogonadism requested by Nsumanganyi, Edwin Elizabeth, NP.  He is on treatment for hyperlipidemia with Crestor  which he continues to tolerate.    During his initial visits, was found to have iatrogenic hypergonadism during his prior visits at which time he was advised to stay off of testosterone  replacement/supplements.  He returns with stable total testosterone  425 more than 2  years without any  testosterone  supplements. Patient is diagnosed with obstructive uropathy, likely with enlarged prostate following with urology.  Patient self caths every few hours to relieve himself. -See notes from prior visit.  He reports that he was diagnosed with hypothyroidism at approximate age of 13.  He was treated with various dose of thyroid  hormone.  He is currently on levothyroxine  75 mcg p.o. daily before breakfast.  His previsit thyroid  function tests are consistent with appropriate replacement.     He denies dysphagia, shortness of breath, nor voice change.     He denies prior history of thyroid  surgery nor thyroid  ablation.  He denies family history of thyroid  dysfunction.    He reports diffuse body aches, heat intolerance.  He denies dysphagia, shortness of breath, nor voice change.  His recent thyroid  ultrasound is unremarkable.  He has no discrete nodules, thyroid  is average in size.     ------------------------------------------------------------------ He did have previous polycythemia with hematocrit of 57, hemoglobin 19.1. 1 other prior measurement showed 1464 total testosterone . He has prior use of bodybuilding androgens/protein preparations.  He denies any prolonged history of use of opioids.  He denies testicular injury, chemotherapy, radiation, nor head injury.  He has 2 grown kids. He notes that he did not know he had supraphysiologic levels of testosterone .  He has medical history of benign prostatic hyperplasia. He uses Cialis  for ED as needed.  He is on vitamin D  for vitamin D  deficiency. He has responded to Crestor  with controlled lipid panel with LDL at 66.   Review of Systems  Constitutional: + Mildly fluctuating body weight,  + fatigue, no subjective hyperthermia, no subjective hypothermia   Objective:       03/08/2024   10:42 AM 09/04/2023    3:55 PM 04/29/2023    4:35 PM  Vitals with BMI  Height 5' 9 5' 9 5' 9  Weight 211 lbs 3 oz 206 lbs 10 oz 212 lbs  BMI  31.17 30.5 31.29  Systolic 116 106   Diastolic 66 64   Pulse 64 68     BP 116/66   Pulse 64   Ht 5' 9 (1.753 m)   Wt 211 lb 3.2 oz (95.8 kg)   BMI 31.19 kg/m   Wt Readings from Last 3 Encounters:  03/08/24 211 lb 3.2 oz (95.8 kg)  09/04/23 206 lb 9.6 oz (93.7 kg)  04/29/23 212 lb (96.2 kg)    Physical Exam  Constitutional:  Body mass index is 31.19 kg/m.,  not in acute distress, normal state of mind Eyes: PERRLA, EOMI, no exophthalmos  Genital exam: Testicles are shrunk bilaterally to 15 cc.  No scrotal mass, no hernia.   CMP ( most  recent) CMP     Component Value Date/Time   NA 139 03/01/2024 1032   K 4.4 03/01/2024 1032   CL 104 03/01/2024 1032   CO2 22 03/01/2024 1032   GLUCOSE 88 03/01/2024 1032   GLUCOSE 81 09/27/2020 1721   BUN 9 03/01/2024 1032   CREATININE 1.74 (H) 03/01/2024 1032   CREATININE 1.56 (H) 09/27/2020 1721   CALCIUM  9.4 03/01/2024 1032   PROT 6.3 03/01/2024 1032   ALBUMIN 4.2 03/01/2024 1032   AST 26 03/01/2024 1032   ALT 26 03/01/2024 1032   ALKPHOS 74 03/01/2024 1032   BILITOT 0.5 03/01/2024 1032   GFRNONAA 40 (L) 09/07/2020 1144   GFRAA 46 (L) 09/07/2020 1144       Lipid Panel ( most recent) Lipid Panel     Component Value Date/Time   CHOL 131 03/01/2024 1032   TRIG 112 03/01/2024 1032   HDL 45 03/01/2024 1032   CHOLHDL 2.9 03/01/2024 1032   CHOLHDL 4.2 09/07/2020 1144   LDLCALC 66 03/01/2024 1032   LDLCALC 118 (H) 09/07/2020 1144   LABVLDL 20 03/01/2024 1032       Total testosterone : 1108, 1464   CBC    Component Value Date/Time   WBC 5.5 10/24/2021 0813   WBC 7.6 09/07/2020 1144   RBC 5.03 10/24/2021 0813   RBC 6.08 (H) 09/07/2020 1144   HGB 15.6 11/18/2022 0000   HGB 15.3 10/24/2021 0813   HCT 46 11/18/2022 0000   HCT 44.1 10/24/2021 0813   PLT 215 10/24/2021 0813   MCV 88 10/24/2021 0813   MCH 30.4 10/24/2021 0813   MCH 29.4 09/07/2020 1144   MCHC 34.7 10/24/2021 0813   MCHC 33.4 09/07/2020 1144   RDW  13.1 10/24/2021 0813   LYMPHSABS 1.8 10/24/2021 0813   EOSABS 0.3 10/24/2021 0813   BASOSABS 0.1 10/24/2021 0813   PSA was 2.9 on September 07, 2020.    Assessment & Plan:   1. Hypothyroidism, unspecified type 2.  Hypogonadism 3.  Hyperlipidemia 4.  Vitamin D  deficiency  I discussed his recent labs.  His total testosterone  is favorable at 425 , maintaining more than 2 years without any testosterone  supplements.  He will not need androgen replacement therapy at this time  I discussed age correlated testosterone  target with him.  As a 73 year old man his average testosterone  will be between 200-350, his current total testosterone  at 425 is more than adequate. He has risk factors including prior use of steroids/androgens for bodybuilding, will need continuous follow-up on his testosterone . He will be kept on expectant management without any initiation of testosterone  for now. -In light of his history of of possible enlarged prostate with obstructive uropathy, he will not need testosterone  supplement at this time.  He is encouraged to continue follow-up with urology.  His previsit labs showed low GFR and high serum creatinine without elevated BUN.  He is advised to stay off of nephrotoxic products and maintain adequate hydration.    -Regarding his hypothyroidism: His recent TSH is consistent with appropriate replacement.  He is advised to continue levothyroxine  75 mcg p.o. daily before breakfast.     - We discussed about the correct intake of his thyroid  hormone, on empty stomach at fasting, with water, separated by at least 30 minutes from breakfast and other medications,  and separated by more than 4 hours from calcium , iron, multivitamins, acid reflux medications (PPIs). -Patient is made aware of the fact that thyroid  hormone replacement is needed for life, dose  to be adjusted by periodic monitoring of thyroid  function tests.  His recent CBC is all on target recovering from his previous  erythrocytosis related to supraphysiologic testosterone  levels.  Responded favorably to his recent initiation of Crestor  5 mg p.o. nightly with LDL controlled to target at 66.  He is advised to continue.     Side effects and precautions discussed with him.  He will continue on omega-3 fatty acids as well. He will continue to benefit from over-the-counter vitamin D  supplement. -He is increasingly engaging with whole food plant-based diet with significant clinical response.   - he is advised to maintain close follow up with Nsumanganyi, Kalombo Cesar, NP for primary care needs.   I spent  25  minutes in the care of the patient today including review of labs from Thyroid  Function, CMP, and other relevant labs ; imaging/biopsy records (current and previous including abstractions from other facilities); face-to-face time discussing  his lab results and symptoms, medications doses, his options of short and long term treatment based on the latest standards of care / guidelines;   and documenting the encounter.  Edwin Perez  participated in the discussions, expressed understanding, and voiced agreement with the above plans.  All questions were answered to his satisfaction. he is encouraged to contact clinic should he have any questions or concerns prior to his return visit.   Follow up plan: Return in about 6 months (around 09/05/2024) for Fasting Labs  in AM B4 8.   Ranny Earl, MD [facility] [address] [place], [place] [address] Phone: [phone]  Fax: [phone]     03/08/2024, 12:45 PM  This note was partially dictated with voice recognition software. Similar sounding words can be transcribed inadequately or may not  be corrected upon review.

## 2024-03-18 ENCOUNTER — Other Ambulatory Visit (HOSPITAL_COMMUNITY): Payer: Self-pay | Admitting: Internal Medicine

## 2024-03-18 DIAGNOSIS — Z87891 Personal history of nicotine dependence: Secondary | ICD-10-CM

## 2024-04-14 ENCOUNTER — Ambulatory Visit

## 2024-04-15 ENCOUNTER — Emergency Department (HOSPITAL_COMMUNITY)
Admission: EM | Admit: 2024-04-15 | Discharge: 2024-04-15 | Disposition: A | Discharge status: Home / Self Care | Attending: Emergency Medicine | Admitting: Emergency Medicine

## 2024-04-15 ENCOUNTER — Emergency Department (HOSPITAL_COMMUNITY)

## 2024-04-15 ENCOUNTER — Other Ambulatory Visit: Payer: Self-pay

## 2024-04-15 ENCOUNTER — Encounter (HOSPITAL_COMMUNITY): Payer: Self-pay | Admitting: Emergency Medicine

## 2024-04-15 DIAGNOSIS — N3001 Acute cystitis with hematuria: Secondary | ICD-10-CM | POA: Diagnosis not present

## 2024-04-15 DIAGNOSIS — R509 Fever, unspecified: Secondary | ICD-10-CM | POA: Diagnosis present

## 2024-04-15 LAB — RESP PANEL BY RT-PCR (RSV, FLU A&B, COVID)  RVPGX2
Influenza A by PCR: NEGATIVE
Influenza B by PCR: NEGATIVE
Resp Syncytial Virus by PCR: NEGATIVE
SARS Coronavirus 2 by RT PCR: NEGATIVE

## 2024-04-15 LAB — URINALYSIS, ROUTINE W REFLEX MICROSCOPIC
Bilirubin Urine: NEGATIVE
Glucose, UA: NEGATIVE mg/dL
Ketones, ur: NEGATIVE mg/dL
Nitrite: NEGATIVE
Protein, ur: 100 mg/dL — AB
Specific Gravity, Urine: 1.023 (ref 1.005–1.030)
WBC, UA: >50 WBC/hpf (ref 0–5)
pH: 5 (ref 5.0–8.0)

## 2024-04-15 LAB — CBC WITH DIFFERENTIAL/PLATELET
Abs Immature Granulocytes: 0.03 K/uL (ref 0.00–0.07)
Basophils Absolute: 0 K/uL (ref 0.0–0.1)
Basophils Relative: 0 %
Eosinophils Absolute: 0.1 K/uL (ref 0.0–0.5)
Eosinophils Relative: 1 %
HCT: 41.5 % (ref 39.0–52.0)
Hemoglobin: 14.2 g/dL (ref 13.0–17.0)
Immature Granulocytes: 0 %
Lymphocytes Relative: 19 %
Lymphs Abs: 1.5 K/uL (ref 0.7–4.0)
MCH: 30.7 pg (ref 26.0–34.0)
MCHC: 34.2 g/dL (ref 30.0–36.0)
MCV: 89.6 fL (ref 80.0–100.0)
Monocytes Absolute: 0.8 K/uL (ref 0.1–1.0)
Monocytes Relative: 10 %
Neutro Abs: 5.7 K/uL (ref 1.7–7.7)
Neutrophils Relative %: 70 %
Platelets: 160 K/uL (ref 150–400)
RBC: 4.63 MIL/uL (ref 4.22–5.81)
RDW: 12.3 % (ref 11.5–15.5)
WBC: 8.2 K/uL (ref 4.0–10.5)
nRBC: 0 % (ref 0.0–0.2)

## 2024-04-15 LAB — COMPREHENSIVE METABOLIC PANEL WITH GFR
ALT: 14 U/L (ref 0–44)
AST: 21 U/L (ref 15–41)
Albumin: 4.1 g/dL (ref 3.5–5.0)
Alkaline Phosphatase: 81 U/L (ref 38–126)
Anion gap: 11 (ref 5–15)
BUN: 10 mg/dL (ref 8–23)
CO2: 26 mmol/L (ref 22–32)
Calcium: 9.3 mg/dL (ref 8.9–10.3)
Chloride: 101 mmol/L (ref 98–111)
Creatinine, Ser: 1.86 mg/dL — ABNORMAL HIGH (ref 0.61–1.24)
GFR, Estimated: 38 mL/min — ABNORMAL LOW (ref >60)
Glucose, Bld: 88 mg/dL (ref 70–99)
Potassium: 3.5 mmol/L (ref 3.5–5.1)
Sodium: 138 mmol/L (ref 135–145)
Total Bilirubin: 0.8 mg/dL (ref 0.0–1.2)
Total Protein: 7.4 g/dL (ref 6.5–8.1)

## 2024-04-15 LAB — LACTIC ACID, PLASMA: Lactic Acid, Venous: 0.8 mmol/L (ref 0.5–1.9)

## 2024-04-15 MED ORDER — SODIUM CHLORIDE 0.9 % IV SOLN
1.0000 g | Freq: Once | INTRAVENOUS | Status: AC
  Administered 2024-04-15: 1 g via INTRAVENOUS
  Filled 2024-04-15: qty 10

## 2024-04-15 MED ORDER — LACTATED RINGERS IV BOLUS
1000.0000 mL | Freq: Once | INTRAVENOUS | Status: AC
  Administered 2024-04-15: 1000 mL via INTRAVENOUS

## 2024-04-15 MED ORDER — KETOROLAC TROMETHAMINE 15 MG/ML IJ SOLN
15.0000 mg | Freq: Once | INTRAMUSCULAR | Status: AC
  Administered 2024-04-15: 15 mg via INTRAVENOUS
  Filled 2024-04-15: qty 1

## 2024-04-15 MED ORDER — SODIUM CHLORIDE 0.9 % IV BOLUS
1000.0000 mL | Freq: Once | INTRAVENOUS | Status: AC
  Administered 2024-04-15: 1000 mL via INTRAVENOUS

## 2024-04-15 MED ORDER — CEPHALEXIN 500 MG PO CAPS: 500.0000 mg | ORAL_CAPSULE | Freq: Three times a day (TID) | ORAL | 0 refills | Status: AC

## 2024-04-15 MED ORDER — ACETAMINOPHEN 325 MG PO TABS
650.0000 mg | ORAL_TABLET | Freq: Once | ORAL | Status: AC
  Administered 2024-04-15: 650 mg via ORAL
  Filled 2024-04-15: qty 2

## 2024-04-15 NOTE — Discharge Instructions (Signed)
 Please take all antibiotics as directed.  Start taking them tomorrow evening.  Follow-up closely with your primary care doctor on an outpatient basis.  Return to emergency department immediately for any new or worsening symptoms.

## 2024-04-15 NOTE — ED Triage Notes (Signed)
 Fever/chills x 2 days. Chronic catheterizes self and smelled odor with white clear d/c from penis. Low grade fevers with muscles aches per pt. Nad. Ambulatory. A/o.

## 2024-04-15 NOTE — ED Provider Notes (Signed)
 [place] EMERGENCY DEPARTMENT AT [facility] Provider Note   CSN: [id number] Arrival date & time: 04/15/24  1516     Patient presents with: Influenza and urine odor with cathing   Edwin Perez is a 73 y.o. male.   Patient is a 73 year old male who presents to the emergency department with a chief complaint of fever, chills, generalized bodyaches, mild cough.  Patient also notes that he has been self cathing for about the past year and did note a strong smell to his urine as well as some white discharge.  Patient notes that he has had no associated chest pain or shortness of breath.  He denies any abdominal pain, nausea, vomiting, diarrhea.  He has had no associated dizziness, lightheadedness or syncope.  He does note that his wife has been sick with similar symptoms.   Influenza Presenting symptoms: cough and fever   Associated symptoms: chills        Prior to Admission medications   Medication Sig Start Date End Date Taking? Authorizing Provider  buPROPion (WELLBUTRIN) 75 MG tablet Take 75 mg by mouth daily. 08/26/23   [provider]  Cholecalciferol (VITAMIN D3 PO) Take 1 tablet by mouth daily.    [provider]  Cyanocobalamin (VITAMIN B-12 PO) Take 1 tablet by mouth daily.    [provider]  escitalopram (LEXAPRO) 20 MG tablet Take 20 mg by mouth daily. Patient not taking: Reported on 09/04/2023 02/19/22   [provider]  fluticasone  (FLONASE ) 50 MCG/ACT nasal spray Place 1 spray into both nostrils 2 (two) times daily. 09/01/22   Stuart Vernell Norris, PA-C  levothyroxine  (SYNTHROID ) 75 MCG tablet Take 1 tablet (75 mcg total) by mouth daily before breakfast. 09/04/23   Nida, Gebreselassie W, MD  loratadine (CLARITIN) 10 MG tablet Take 10 mg by mouth daily as needed. 03/18/23   [provider]  Multiple Vitamins-Minerals (MULTIVITAMIN ADULTS PO) Take 1 tablet by mouth daily.    [provider]  Omega-3 Fatty Acids  (FISH OIL) 1000 MG CPDR Take 1,000 mg by mouth daily. Patient not taking: Reported on 03/06/2023    [provider]  rosuvastatin  (CRESTOR ) 5 MG tablet Take 1 tablet (5 mg total) by mouth at bedtime. 03/08/24   Nida, Gebreselassie W, MD  tadalafil  (CIALIS ) 5 MG tablet Take 1 tablet (5 mg total) by mouth as needed for erectile dysfunction. 11/07/21   Nida, Gebreselassie W, MD  tamsulosin (FLOMAX) 0.4 MG CAPS capsule Take 0.4 mg by mouth daily. Patient not taking: Reported on 03/08/2024    [provider]    Allergies: Patient has no known allergies.    Review of Systems  Constitutional:  Positive for chills and fever.  Respiratory:  Positive for cough.   All other systems reviewed and are negative.   Updated Vital Signs BP 135/66   Pulse 86   Temp 100.3 F (37.9 C) (Oral)   Resp 18   SpO2 99%   Physical Exam Vitals and nursing note reviewed.  Constitutional:      General: He is not in acute distress.    Appearance: Normal appearance. He is not ill-appearing.  HENT:     Head: Normocephalic and atraumatic.     Nose: Nose normal.     Mouth/Throat:     Mouth: Mucous membranes are moist.  Eyes:     Extraocular Movements: Extraocular movements intact.     Conjunctiva/sclera: Conjunctivae normal.     Pupils: Pupils are equal, round, and  reactive to light.  Cardiovascular:     Rate and Rhythm: Normal rate and regular rhythm.     Pulses: Normal pulses.     Heart sounds: Normal heart sounds. No murmur heard.    No gallop.  Pulmonary:     Effort: Pulmonary effort is normal. No respiratory distress.     Breath sounds: Normal breath sounds. No stridor. No wheezing, rhonchi or rales.  Abdominal:     General: Abdomen is flat. Bowel sounds are normal. There is no distension.     Palpations: Abdomen is soft.     Tenderness: There is no abdominal tenderness. There is no guarding.  Musculoskeletal:        General: Normal range of motion.     Cervical back: Normal range  of motion and neck supple.  Skin:    General: Skin is warm and dry.     Findings: No rash.  Neurological:     General: No focal deficit present.     Mental Status: He is alert and oriented to person, place, and time. Mental status is at baseline.  Psychiatric:        Mood and Affect: Mood normal.        Behavior: Behavior normal.        Thought Content: Thought content normal.        Judgment: Judgment normal.     (all labs ordered are listed, but only abnormal results are displayed) Labs Reviewed  RESP PANEL BY RT-PCR (RSV, FLU A&B, COVID)  RVPGX2  CULTURE, BLOOD (ROUTINE X 2)  CULTURE, BLOOD (ROUTINE X 2)  URINALYSIS, ROUTINE W REFLEX MICROSCOPIC  COMPREHENSIVE METABOLIC PANEL WITH GFR  CBC WITH DIFFERENTIAL/PLATELET  LACTIC ACID, PLASMA  LACTIC ACID, PLASMA    EKG: None  Radiology: DG Chest Port 1 View Result Date: 04/15/2024 CLINICAL DATA:  Fever, chills for 2 days EXAM: PORTABLE CHEST 1 VIEW COMPARISON:  05/05/2021 FINDINGS: The heart size and mediastinal contours are within normal limits. Both lungs are clear. The visualized skeletal structures are unremarkable. IMPRESSION: No active disease. Electronically Signed   By: Ozell Daring M.D.   On: 04/15/2024 16:31     Procedures   Medications Ordered in the ED  ketorolac (TORADOL) 15 MG/ML injection 15 mg (has no administration in time range)  acetaminophen  (TYLENOL ) tablet 650 mg (has no administration in time range)  sodium chloride 0.9 % bolus 1,000 mL (1,000 mLs Intravenous New Bag/Given 04/15/24 1632)    Clinical Course as of 04/15/24 2148  Thu Apr 15, 2024  1639 DG Chest [place] 1 View [CR]    Clinical Course User Index [CR] Germaine Mitzie MALVA Jann                                 Medical Decision Making Amount and/or Complexity of Data Reviewed Labs: ordered. Radiology: ordered.  Risk OTC drugs. Prescription drug management.   This patient presents to the ED for concern of fever, chills,  body aches, mild cough differential diagnosis includes pneumonia, acute viral syndrome, sepsis, urinary tract infection, electrolyte derangement, acute kidney injury, dehydration    Additional history obtained:  Additional history obtained from family External records from outside source obtained and reviewed including medical records   Lab Tests:  I Ordered, and personally interpreted labs.  The pertinent results include: No leukocytosis, no anemia, creatinine at baseline, normal liver function electrolytes, negative lactic acid, urinalysis with moderate hemoglobin  and leukocytes, negative respiratory panel   Imaging Studies ordered:  I ordered imaging studies including chest x-ray I independently visualized and interpreted imaging which showed no acute cardiopulmonary process I agree with the radiologist interpretation   Medicines ordered and prescription drug management:  I ordered medication including Rocephin, IV fluids, Tylenol , Toradol for fever, body aches, chills, urinary tract infection Reevaluation of the patient after these medicines showed that the patient improved I have reviewed the patients home medicines and have made adjustments as needed   Problem List / ED Course:  Patient is doing well at this time and is stable for discharge home.  Discussed with patient urinalysis is consistent with urinary tract infection.  Chest x-ray had no indication for pneumonia.  Patient has stable vital signs at this point and blood work is otherwise unremarkable and do not suspect sepsis at this time.  Patient notes he feels completely back to his baseline.  He is tolerating p.o. intake without difficulty.  Do not suspect that admission or any further workup is warranted at this time.  He was provided with strict return precautions as well as need for close follow-up with his primary care doctor and urologist.  Patient voiced understanding to the plan and had no additional  questions.   Social Determinants of Health:  None        Final diagnoses:  None    ED Discharge Orders     None          Daralene Lonni JONETTA DEVONNA 04/15/24 2151    Franklyn Sid SAILOR, MD 04/16/24 2350

## 2024-04-15 NOTE — ED Notes (Signed)
 Urine not labeled after straight cath; Lab threw it out. Will attempt again later.

## 2024-04-18 LAB — URINE CULTURE: Culture: >=100000 — AB

## 2024-04-19 ENCOUNTER — Telehealth (HOSPITAL_BASED_OUTPATIENT_CLINIC_OR_DEPARTMENT_OTHER): Payer: Self-pay | Admitting: *Deleted

## 2024-04-19 NOTE — Telephone Encounter (Signed)
 Post ED Visit - Positive Culture Follow-up  Culture report reviewed by antimicrobial stewardship pharmacist: [facility] [x]  Maurilio Patten, Pharm.D. []  Venetia Gully, Pharm.D., BCPS AQ-ID []  Garrel Crews, Pharm.D., BCPS []  Almarie Lunger, [address].D., BCPS []  [place], [place].D., BCPS, AAHIVP []  Rosaline Bihari, Pharm.D., BCPS, AAHIVP []  Vernell Meier, PharmD, BCPS []  Latanya Hint, PharmD, BCPS []  Donald Medley, PharmD, BCPS []  Rocky Bold, PharmD []  Dorothyann Alert, PharmD, BCPS []  Morene Babe, PharmD  [facility] []  Rosaline Edison, PharmD []  Romona Bliss, PharmD []  Dolphus Roller, PharmD []  Veva Seip, Rph []  Vernell Daunt) Leonce, PharmD []  Eva Allis, PharmD []  Rosaline Millet, PharmD []  Iantha Batch, PharmD []  Arvin Gauss, PharmD []  Wanda Hasting, PharmD []  Ronal Rav, PharmD []  Rocky Slade, PharmD []  Bard Jeans, PharmD   Positive urine culture Treated with Cephalexin, organism sensitive to the same and no further patient follow-up is required at this time.  Jama Lisle Blondie 04/19/2024, 8:29 AM

## 2024-04-20 LAB — CULTURE, BLOOD (ROUTINE X 2)
Culture: NO GROWTH
Culture: NO GROWTH
Special Requests: ADEQUATE
Special Requests: ADEQUATE

## 2024-04-23 ENCOUNTER — Ambulatory Visit (HOSPITAL_COMMUNITY)
Admission: RE | Admit: 2024-04-23 | Discharge: 2024-04-23 | Disposition: A | Discharge status: Home / Self Care | Source: Ambulatory Visit | Attending: Internal Medicine | Admitting: Internal Medicine

## 2024-04-23 DIAGNOSIS — Z87891 Personal history of nicotine dependence: Secondary | ICD-10-CM | POA: Diagnosis present

## 2024-06-04 ENCOUNTER — Other Ambulatory Visit: Payer: Self-pay

## 2024-06-04 ENCOUNTER — Encounter (HOSPITAL_COMMUNITY): Payer: Self-pay

## 2024-06-04 ENCOUNTER — Emergency Department (HOSPITAL_COMMUNITY)
Admission: EM | Admit: 2024-06-04 | Discharge: 2024-06-04 | Disposition: A | Discharge status: Home / Self Care | Attending: Emergency Medicine | Admitting: Emergency Medicine

## 2024-06-04 DIAGNOSIS — Z720 Tobacco use: Secondary | ICD-10-CM | POA: Diagnosis not present

## 2024-06-04 DIAGNOSIS — D72829 Elevated white blood cell count, unspecified: Secondary | ICD-10-CM | POA: Diagnosis not present

## 2024-06-04 DIAGNOSIS — N390 Urinary tract infection, site not specified: Secondary | ICD-10-CM | POA: Diagnosis not present

## 2024-06-04 DIAGNOSIS — R509 Fever, unspecified: Secondary | ICD-10-CM | POA: Diagnosis present

## 2024-06-04 LAB — BASIC METABOLIC PANEL WITH GFR
Anion gap: 9 (ref 5–15)
BUN: 12 mg/dL (ref 8–23)
CO2: 26 mmol/L (ref 22–32)
Calcium: 8.9 mg/dL (ref 8.9–10.3)
Chloride: 103 mmol/L (ref 98–111)
Creatinine, Ser: 1.4 mg/dL — ABNORMAL HIGH (ref 0.61–1.24)
GFR, Estimated: 54 mL/min — ABNORMAL LOW
Glucose, Bld: 99 mg/dL (ref 70–99)
Potassium: 4 mmol/L (ref 3.5–5.1)
Sodium: 137 mmol/L (ref 135–145)

## 2024-06-04 LAB — CBC WITH DIFFERENTIAL/PLATELET
Abs Immature Granulocytes: 0.05 K/uL (ref 0.00–0.07)
Basophils Absolute: 0 K/uL (ref 0.0–0.1)
Basophils Relative: 0 %
Eosinophils Absolute: 0.1 K/uL (ref 0.0–0.5)
Eosinophils Relative: 1 %
HCT: 40.8 % (ref 39.0–52.0)
Hemoglobin: 13.8 g/dL (ref 13.0–17.0)
Immature Granulocytes: 0 %
Lymphocytes Relative: 10 %
Lymphs Abs: 1.2 K/uL (ref 0.7–4.0)
MCH: 30.5 pg (ref 26.0–34.0)
MCHC: 33.8 g/dL (ref 30.0–36.0)
MCV: 90.1 fL (ref 80.0–100.0)
Monocytes Absolute: 1 K/uL (ref 0.1–1.0)
Monocytes Relative: 8 %
Neutro Abs: 9.8 K/uL — ABNORMAL HIGH (ref 1.7–7.7)
Neutrophils Relative %: 81 %
Platelets: 188 K/uL (ref 150–400)
RBC: 4.53 MIL/uL (ref 4.22–5.81)
RDW: 12.6 % (ref 11.5–15.5)
WBC: 12.1 K/uL — ABNORMAL HIGH (ref 4.0–10.5)
nRBC: 0 % (ref 0.0–0.2)

## 2024-06-04 LAB — URINALYSIS, ROUTINE W REFLEX MICROSCOPIC
Bilirubin Urine: NEGATIVE
Glucose, UA: NEGATIVE mg/dL
Ketones, ur: NEGATIVE mg/dL
Nitrite: POSITIVE — AB
Protein, ur: NEGATIVE mg/dL
Specific Gravity, Urine: 1.011 (ref 1.005–1.030)
pH: 7 (ref 5.0–8.0)

## 2024-06-04 LAB — RESP PANEL BY RT-PCR (RSV, FLU A&B, COVID)  RVPGX2
Influenza A by PCR: NEGATIVE
Influenza B by PCR: NEGATIVE
Resp Syncytial Virus by PCR: NEGATIVE
SARS Coronavirus 2 by RT PCR: NEGATIVE

## 2024-06-04 LAB — LACTIC ACID, PLASMA: Lactic Acid, Venous: 1.2 mmol/L (ref 0.5–1.9)

## 2024-06-04 MED ORDER — CEFUROXIME AXETIL 500 MG PO TABS: 500.0000 mg | ORAL_TABLET | Freq: Two times a day (BID) | ORAL | 0 refills | Status: AC

## 2024-06-04 MED ORDER — ACETAMINOPHEN 500 MG PO TABS
1000.0000 mg | ORAL_TABLET | Freq: Once | ORAL | Status: AC
  Administered 2024-06-04: 1000 mg via ORAL
  Filled 2024-06-04: qty 2

## 2024-06-04 NOTE — ED Notes (Signed)
 Pt straight cathed himself to obtain the urine sample

## 2024-06-04 NOTE — ED Triage Notes (Signed)
 Pt c/o fever, chills. Pt denies vomiting. Pt reports had same symptoms over a month ago and was diagnosed with a UTI because pt self caths.

## 2024-06-04 NOTE — ED Provider Notes (Signed)
 " [place] EMERGENCY DEPARTMENT AT [facility] Provider Note   CSN: [id number] Arrival date & time: 06/04/24  1038     Patient presents with: Fever   Edwin Perez is a 73 y.o. male.   73 year old male complaining of low-grade fever at home, change in appearance/odor of urine.  Patient self catheterizes and has a history of recurrent UTIs, reports that he had similar symptoms previously and was diagnosed with a UTI.  Patient reports that he has been just feeling awful, like the flu for about 2 days, with a mild cough and generalized fatigue.  Patient was found to have a temperature at home of 99.6 before presenting to the emergency department today.  Denies dysuria/hematuria, abdominal pain/flank pain, nausea/vomiting/diarrhea, chest pain, shortness of breath.   Fever      Prior to Admission medications  Medication Sig Start Date End Date Taking? Authorizing Provider  buPROPion (WELLBUTRIN) 75 MG tablet Take 75 mg by mouth daily. 08/26/23   [provider]  Cholecalciferol (VITAMIN D3 PO) Take 1 tablet by mouth daily.    [provider]  Cyanocobalamin (VITAMIN B-12 PO) Take 1 tablet by mouth daily.    [provider]  escitalopram (LEXAPRO) 20 MG tablet Take 20 mg by mouth daily. Patient not taking: Reported on 09/04/2023 02/19/22   [provider]  fluticasone  (FLONASE ) 50 MCG/ACT nasal spray Place 1 spray into both nostrils 2 (two) times daily. 09/01/22   Stuart Vernell Norris, PA-C  levothyroxine  (SYNTHROID ) 75 MCG tablet Take 1 tablet (75 mcg total) by mouth daily before breakfast. 09/04/23   Nida, Gebreselassie W, MD  loratadine (CLARITIN) 10 MG tablet Take 10 mg by mouth daily as needed. 03/18/23   [provider]  Multiple Vitamins-Minerals (MULTIVITAMIN ADULTS PO) Take 1 tablet by mouth daily.    [provider]  Omega-3 Fatty Acids (FISH OIL) 1000 MG CPDR Take 1,000 mg by mouth daily. Patient not taking:  Reported on 03/06/2023    [provider]  rosuvastatin  (CRESTOR ) 5 MG tablet Take 1 tablet (5 mg total) by mouth at bedtime. 03/08/24   Lenis Ethelle ORN, MD  tadalafil  (CIALIS ) 5 MG tablet Take 1 tablet (5 mg total) by mouth as needed for erectile dysfunction. 11/07/21   Nida, Gebreselassie W, MD  tamsulosin (FLOMAX) 0.4 MG CAPS capsule Take 0.4 mg by mouth daily. Patient not taking: Reported on 03/08/2024    [provider]    Allergies: Patient has no known allergies.    Review of Systems  Constitutional:  Positive for fever.    Updated Vital Signs  Vitals:   06/04/24 1122 06/04/24 1425 06/04/24 1507 06/04/24 1515  BP: 131/69   103/63  Pulse: 100 83 87 78  Resp: 18   18  Temp: 98.6 F (37 C) (!) 101.7 F (38.7 C) 97.8 F (36.6 C)   TempSrc: Oral Oral Oral   SpO2: 100% 94% 94% 93%  Weight:      Height:         Physical Exam Vitals and nursing note reviewed.  HENT:     Head: Normocephalic.  Eyes:     Extraocular Movements: Extraocular movements intact.  Cardiovascular:     Rate and Rhythm: Normal rate and regular rhythm.     Heart sounds: Normal heart sounds.  Pulmonary:     Effort: Pulmonary effort is normal.     Breath sounds: Normal breath sounds.  Abdominal:     Palpations: Abdomen is soft.  Tenderness: There is no abdominal tenderness. There is no right CVA tenderness, left CVA tenderness or guarding.  Musculoskeletal:     Cervical back: Normal range of motion.     Comments: Moves all extremities spontaneously without difficulty  Skin:    General: Skin is warm and dry.  Neurological:     Mental Status: He is alert and oriented to person, place, and time.     (all labs ordered are listed, but only abnormal results are displayed) Labs Reviewed  URINALYSIS, ROUTINE W REFLEX MICROSCOPIC - Abnormal; Notable for the following components:      Result Value   Hgb urine dipstick SMALL (*)    Nitrite POSITIVE (*)    Leukocytes,Ua SMALL  (*)    Bacteria, UA RARE (*)    All other components within normal limits  CBC WITH DIFFERENTIAL/PLATELET - Abnormal; Notable for the following components:   WBC 12.1 (*)    Neutro Abs 9.8 (*)    All other components within normal limits  BASIC METABOLIC PANEL WITH GFR - Abnormal; Notable for the following components:   Creatinine, Ser 1.40 (*)    GFR, Estimated 54 (*)    All other components within normal limits  RESP PANEL BY RT-PCR (RSV, FLU A&B, COVID)  RVPGX2  URINE CULTURE  CULTURE, BLOOD (ROUTINE X 2)  CULTURE, BLOOD (ROUTINE X 2)  LACTIC ACID, PLASMA    EKG: None  Radiology: No results found.   Procedures   Medications Ordered in the ED  acetaminophen  (TYLENOL ) tablet 1,000 mg (1,000 mg Oral Given 06/04/24 1433)                                    Medical Decision Making This patient presents to the ED for concern of urinary symptoms and fever/fatigue/cough, this involves an extensive number of treatment options, and is a complaint that carries with it a high risk of complications and morbidity.  The differential diagnosis includes UTI, pyelonephritis, COVID/flu/RSV, other viral upper respiratory illness   Co morbidities that complicate the patient evaluation  UTI in the setting of self catheterization   Additional history obtained:  Additional history obtained from record review External records from outside source obtained and reviewed including prior ED note   Lab Tests:  I Ordered, and personally interpreted labs.  The pertinent results include: CBC with leukocytosis of 12.1, otherwise unremarkable. Urinalysis notable for positive nitrites with small leukocytes and rare bacteria, will send for urine culture.  BMP notable for creatinine of 1.4, this is largely stable from his baseline.  Lactic 1.2. COVID/Flu/RSV testing was significantly delayed due to lab error, respiratory panel negative  Cardiac Monitoring: / EKG:  The patient was maintained on a  cardiac monitor.  I personally viewed and interpreted the cardiac monitored which showed an underlying rhythm of: NSR   Problem List / ED Course / Critical interventions / Medication management  I ordered medication including Tylenol  for fever Reevaluation of the patient after these medicines showed that the patient resolved I have reviewed the patients home medicines and have made adjustments as needed   Social Determinants of Health:  Tobacco use   Test / Admission - Considered:  Physical exam notable as above, benign abdominal exam without tenderness/guarding.  Patient is well-appearing and in no acute distress.  Febrile after arrival with Tmax of 101.7, fever resolved with administration of Tylenol .  Hemodynamically stable.  COVID/flu/RSV testing  negative. Concern for pyelonephritis despite reassuring physical exam findings, will obtain additional labs. Fever + leukocytosis > 12 meets SIRS criteria, lactic and blood cultures obtained.  Lactic within normal limits, blood cultures pending.  Patient does have a urinary tract infection however given reassuring physical exam findings I have a low suspicion for pyelonephritis with this, I suspect that patient may also be suffering from a viral respiratory illness as he has had a cough/fever/fatigue and generally felt flulike, was febrile here but this resolved with Tylenol .  At time of my reassessment patient is well-appearing and in no acute distress, fever has not returned.  At this time I do feel that he is safe for discharge with a 10-day course of antibiotics for urinary tract infection.  I recommend that you follow-up closely with his primary care provider given his recurrent UTIs over the past several months.  Patient voiced understanding is in agreement this plan, strict return precautions discussed, he is appropriate for discharge at this time.   Staffed with Dr. Steinl who evaluated this patient at the bedside  Amount and/or  Complexity of Data Reviewed Labs: ordered.  Risk OTC drugs. Prescription drug management.        Final diagnoses:  Urinary tract infection with hematuria, site unspecified  Fever, unspecified fever cause    ED Discharge Orders          Ordered    cefUROXime  (CEFTIN ) 500 MG tablet  2 times daily with meals        06/04/24 1721               Glendia Rocky SAILOR, [place] 06/04/24 1738    Bernard Drivers, MD 06/05/24 1455  "

## 2024-06-04 NOTE — ED Notes (Signed)
 Pt/family received d/c paperwork at this time. After going over the paperwork any questions, comments, or concerns were answered to the best of this nurse's knowledge. The pt/family verbally acknowledged the teachings/instructions.

## 2024-06-04 NOTE — Discharge Instructions (Addendum)
 Your urinalysis today shows evidence of a urinary tract infection, start Ceftin  and take 1 tablet by mouth twice daily for 10 days.  I suspect that you may also have a viral respiratory illness that may be contributing to your fever, continue Tylenol  as needed for fever/body aches.  Return to the emergency department if your symptoms worsen.  Follow-up with your primary care provider.

## 2024-06-06 LAB — URINE CULTURE: Culture: >=100000 — AB

## 2024-06-07 ENCOUNTER — Telehealth (HOSPITAL_BASED_OUTPATIENT_CLINIC_OR_DEPARTMENT_OTHER): Payer: Self-pay | Admitting: *Deleted

## 2024-06-07 NOTE — Telephone Encounter (Signed)
 Post ED Visit - Positive Culture Follow-up  Culture report reviewed by antimicrobial stewardship pharmacist: [facility] [x]  Leonor Bash, [place].D. []  Venetia Gully, [address].D., BCPS AQ-ID []  Garrel Crews, Pharm.D., BCPS []  Almarie Lunger, [address].D., BCPS []  [place], [address].D., BCPS, AAHIVP []  Rosaline Bihari, Pharm.D., BCPS, AAHIVP []  Vernell Meier, PharmD, BCPS []  Latanya Hint, PharmD, BCPS []  Donald Medley, PharmD, BCPS []  Rocky Bold, PharmD []  Dorothyann Alert, PharmD, BCPS []  Morene Babe, PharmD  [facility] []  Rosaline Edison, PharmD []  Romona Bliss, PharmD []  Dolphus Roller, PharmD []  Veva Seip, Rph []  Vernell Daunt) Leonce, PharmD []  Eva Allis, PharmD []  Rosaline Millet, PharmD []  Iantha Batch, PharmD []  Arvin Gauss, PharmD []  Wanda Hasting, PharmD []  Ronal Rav, PharmD []  Rocky Slade, PharmD []  Bard Jeans, PharmD   Positive urine culture Treated with cefuroxime , organism sensitive to the same and no further patient follow-up is required at this time.  Lorita Barnie Pereyra 06/07/2024, 12:03 PM

## 2024-06-09 LAB — CULTURE, BLOOD (ROUTINE X 2)
Culture: NO GROWTH
Culture: NO GROWTH
Special Requests: ADEQUATE
Special Requests: ADEQUATE

## 2024-07-13 ENCOUNTER — Encounter (HOSPITAL_COMMUNITY): Payer: Self-pay | Admitting: Specialist

## 2024-07-13 ENCOUNTER — Other Ambulatory Visit (HOSPITAL_COMMUNITY): Payer: Self-pay | Admitting: Specialist

## 2024-07-13 DIAGNOSIS — N1330 Unspecified hydronephrosis: Secondary | ICD-10-CM

## 2024-07-13 DIAGNOSIS — R339 Retention of urine, unspecified: Secondary | ICD-10-CM

## 2024-07-13 DIAGNOSIS — N319 Neuromuscular dysfunction of bladder, unspecified: Secondary | ICD-10-CM

## 2024-07-14 ENCOUNTER — Other Ambulatory Visit (HOSPITAL_COMMUNITY): Payer: Self-pay | Admitting: Urology

## 2024-07-14 DIAGNOSIS — N319 Neuromuscular dysfunction of bladder, unspecified: Secondary | ICD-10-CM

## 2024-07-14 DIAGNOSIS — N1330 Unspecified hydronephrosis: Secondary | ICD-10-CM

## 2024-07-20 ENCOUNTER — Ambulatory Visit (HOSPITAL_COMMUNITY)

## 2024-09-06 ENCOUNTER — Ambulatory Visit: Admitting: "Endocrinology
